# Patient Record
Sex: Male | Born: 1960 | Race: White | Hispanic: No | Marital: Married | Smoking: Never smoker
Health system: Southern US, Community
[De-identification: ages and names within clinical notes are randomized; demographics above are authoritative.]

## PROBLEM LIST (undated history)

## (undated) DIAGNOSIS — K219 Gastro-esophageal reflux disease without esophagitis: Secondary | ICD-10-CM

## (undated) DIAGNOSIS — E785 Hyperlipidemia, unspecified: Secondary | ICD-10-CM

## (undated) DIAGNOSIS — J189 Pneumonia, unspecified organism: Secondary | ICD-10-CM

## (undated) DIAGNOSIS — I10 Essential (primary) hypertension: Secondary | ICD-10-CM

## (undated) DIAGNOSIS — F32A Depression, unspecified: Secondary | ICD-10-CM

## (undated) DIAGNOSIS — M797 Fibromyalgia: Secondary | ICD-10-CM

## (undated) DIAGNOSIS — M1990 Unspecified osteoarthritis, unspecified site: Secondary | ICD-10-CM

## (undated) DIAGNOSIS — F329 Major depressive disorder, single episode, unspecified: Secondary | ICD-10-CM

## (undated) DIAGNOSIS — F419 Anxiety disorder, unspecified: Secondary | ICD-10-CM

## (undated) DIAGNOSIS — G8929 Other chronic pain: Secondary | ICD-10-CM

## (undated) DIAGNOSIS — G629 Polyneuropathy, unspecified: Secondary | ICD-10-CM

## (undated) DIAGNOSIS — M549 Dorsalgia, unspecified: Secondary | ICD-10-CM

## (undated) HISTORY — PX: SHOULDER SURGERY: SHX246

## (undated) HISTORY — PX: CARPAL TUNNEL RELEASE: SHX101

## (undated) HISTORY — PX: MANDIBLE RECONSTRUCTION: SHX431

---

## 2000-08-26 ENCOUNTER — Encounter: Admission: RE | Admit: 2000-08-26 | Discharge: 2000-08-26 | Payer: Self-pay | Admitting: Family Medicine

## 2000-08-26 ENCOUNTER — Encounter: Payer: Self-pay | Admitting: Family Medicine

## 2003-07-19 ENCOUNTER — Emergency Department (HOSPITAL_COMMUNITY): Admission: EM | Admit: 2003-07-19 | Discharge: 2003-07-19 | Payer: Self-pay | Admitting: Emergency Medicine

## 2005-01-09 ENCOUNTER — Emergency Department (HOSPITAL_COMMUNITY): Admission: EM | Admit: 2005-01-09 | Discharge: 2005-01-09 | Payer: Self-pay | Admitting: Emergency Medicine

## 2005-01-30 ENCOUNTER — Ambulatory Visit (HOSPITAL_COMMUNITY): Admission: RE | Admit: 2005-01-30 | Discharge: 2005-01-30 | Payer: Self-pay | Admitting: Sports Medicine

## 2006-03-29 ENCOUNTER — Ambulatory Visit (HOSPITAL_COMMUNITY): Admission: RE | Admit: 2006-03-29 | Discharge: 2006-03-29 | Payer: Self-pay | Admitting: Sports Medicine

## 2006-04-22 ENCOUNTER — Emergency Department (HOSPITAL_COMMUNITY): Admission: EM | Admit: 2006-04-22 | Discharge: 2006-04-22 | Payer: Self-pay | Admitting: Emergency Medicine

## 2006-07-04 ENCOUNTER — Emergency Department (HOSPITAL_COMMUNITY): Admission: EM | Admit: 2006-07-04 | Discharge: 2006-07-04 | Payer: Self-pay | Admitting: Emergency Medicine

## 2006-07-16 ENCOUNTER — Emergency Department (HOSPITAL_COMMUNITY): Admission: EM | Admit: 2006-07-16 | Discharge: 2006-07-16 | Payer: Self-pay | Admitting: Emergency Medicine

## 2007-01-22 ENCOUNTER — Emergency Department (HOSPITAL_COMMUNITY): Admission: EM | Admit: 2007-01-22 | Discharge: 2007-01-22 | Payer: Self-pay | Admitting: Family Medicine

## 2007-08-05 ENCOUNTER — Emergency Department (HOSPITAL_COMMUNITY): Admission: EM | Admit: 2007-08-05 | Discharge: 2007-08-05 | Payer: Self-pay | Admitting: Family Medicine

## 2009-06-24 ENCOUNTER — Ambulatory Visit (HOSPITAL_COMMUNITY): Admission: RE | Admit: 2009-06-24 | Discharge: 2009-06-24 | Payer: Self-pay | Admitting: Sports Medicine

## 2010-06-11 ENCOUNTER — Encounter: Payer: Self-pay | Admitting: Sports Medicine

## 2014-11-10 ENCOUNTER — Emergency Department (HOSPITAL_BASED_OUTPATIENT_CLINIC_OR_DEPARTMENT_OTHER)
Admission: EM | Admit: 2014-11-10 | Discharge: 2014-11-10 | Disposition: A | Discharge status: Home / Self Care | Payer: Self-pay | Attending: Emergency Medicine | Admitting: Emergency Medicine

## 2014-11-10 ENCOUNTER — Encounter (HOSPITAL_BASED_OUTPATIENT_CLINIC_OR_DEPARTMENT_OTHER): Payer: Self-pay | Admitting: *Deleted

## 2014-11-10 ENCOUNTER — Emergency Department (HOSPITAL_BASED_OUTPATIENT_CLINIC_OR_DEPARTMENT_OTHER): Payer: Self-pay

## 2014-11-10 DIAGNOSIS — I10 Essential (primary) hypertension: Secondary | ICD-10-CM | POA: Insufficient documentation

## 2014-11-10 DIAGNOSIS — K219 Gastro-esophageal reflux disease without esophagitis: Secondary | ICD-10-CM | POA: Insufficient documentation

## 2014-11-10 DIAGNOSIS — S93402A Sprain of unspecified ligament of left ankle, initial encounter: Secondary | ICD-10-CM | POA: Insufficient documentation

## 2014-11-10 DIAGNOSIS — Y9389 Activity, other specified: Secondary | ICD-10-CM | POA: Insufficient documentation

## 2014-11-10 DIAGNOSIS — Z79899 Other long term (current) drug therapy: Secondary | ICD-10-CM | POA: Insufficient documentation

## 2014-11-10 DIAGNOSIS — G629 Polyneuropathy, unspecified: Secondary | ICD-10-CM | POA: Insufficient documentation

## 2014-11-10 DIAGNOSIS — Y998 Other external cause status: Secondary | ICD-10-CM | POA: Insufficient documentation

## 2014-11-10 DIAGNOSIS — X58XXXA Exposure to other specified factors, initial encounter: Secondary | ICD-10-CM | POA: Insufficient documentation

## 2014-11-10 DIAGNOSIS — Y9289 Other specified places as the place of occurrence of the external cause: Secondary | ICD-10-CM | POA: Insufficient documentation

## 2014-11-10 HISTORY — DX: Gastro-esophageal reflux disease without esophagitis: K21.9

## 2014-11-10 HISTORY — DX: Essential (primary) hypertension: I10

## 2014-11-10 HISTORY — DX: Dorsalgia, unspecified: M54.9

## 2014-11-10 HISTORY — DX: Polyneuropathy, unspecified: G62.9

## 2014-11-10 NOTE — Discharge Instructions (Signed)
RICE: Routine Care for Injuries  The routine care of many injuries includes Rest, Ice, Compression, and Elevation (RICE).  HOME CARE INSTRUCTIONS   Rest is needed to allow your body to heal. Routine activities can usually be resumed when comfortable. Injured tendons and bones can take up to 6 weeks to heal. Tendons are the cord-like structures that attach muscle to bone.   Ice following an injury helps keep the swelling down and reduces pain.   Put ice in a plastic bag.   Place a towel between your skin and the bag.   Leave the ice on for 15-20 minutes, 3-4 times a day, or as directed by your health care provider. Do this while awake, for the first 24 to 48 hours. After that, continue as directed by your caregiver.   Compression helps keep swelling down. It also gives support and helps with discomfort. If an elastic bandage has been applied, it should be removed and reapplied every 3 to 4 hours. It should not be applied tightly, but firmly enough to keep swelling down. Watch fingers or toes for swelling, bluish discoloration, coldness, numbness, or excessive pain. If any of these problems occur, remove the bandage and reapply loosely. Contact your caregiver if these problems continue.   Elevation helps reduce swelling and decreases pain. With extremities, such as the arms, hands, legs, and feet, the injured area should be placed near or above the level of the heart, if possible.  SEEK IMMEDIATE MEDICAL CARE IF:   You have persistent pain and swelling.   You develop redness, numbness, or unexpected weakness.   Your symptoms are getting worse rather than improving after several days.  These symptoms may indicate that further evaluation or further X-rays are needed. Sometimes, X-rays may not show a small broken bone (fracture) until 1 week or 10 days later. Make a follow-up appointment with your caregiver. Ask when your X-ray results will be ready. Make sure you get your X-ray results.  Document Released:  08/19/2000 Document Revised: 05/12/2013 Document Reviewed: 10/06/2010  Exit[employer] Patient Information 2015 [employer]. This information is not intended to replace advice given to you by your health care provider. Make sure you discuss any questions you have with your health care provider.  Ankle Sprain  An ankle sprain is an injury to the strong, fibrous tissues (ligaments) that hold the bones of your ankle joint together.   CAUSES  An ankle sprain is usually caused by a fall or by twisting your ankle. Ankle sprains most commonly occur when you step on the outer edge of your foot, and your ankle turns inward. People who participate in sports are more prone to these types of injuries.   SYMPTOMS    Pain in your ankle. The pain may be present at rest or only when you are trying to stand or walk.   Swelling.   Bruising. Bruising may develop immediately or within 1 to 2 days after your injury.   Difficulty standing or walking, particularly when turning corners or changing directions.  DIAGNOSIS   Your caregiver will ask you details about your injury and perform a physical exam of your ankle to determine if you have an ankle sprain. During the physical exam, your caregiver will press on and apply pressure to specific areas of your foot and ankle. Your caregiver will try to move your ankle in certain ways. An X-ray exam may be done to be sure a bone was not broken or a ligament did not separate   from one of the bones in your ankle (avulsion fracture).   TREATMENT   Certain types of braces can help stabilize your ankle. Your caregiver can make a recommendation for this. Your caregiver may recommend the use of medicine for pain. If your sprain is severe, your caregiver may refer you to a surgeon who helps to restore function to parts of your skeletal system (orthopedist) or a physical therapist.  HOME CARE INSTRUCTIONS    Apply ice to your injury for 1-2 days or as directed by your caregiver. Applying ice helps to  reduce inflammation and pain.   Put ice in a plastic bag.   Place a towel between your skin and the bag.   Leave the ice on for 15-20 minutes at a time, every 2 hours while you are awake.   Only take over-the-counter or prescription medicines for pain, discomfort, or fever as directed by your caregiver.   Elevate your injured ankle above the level of your heart as much as possible for 2-3 days.   If your caregiver recommends crutches, use them as instructed. Gradually put weight on the affected ankle. Continue to use crutches or a cane until you can walk without feeling pain in your ankle.   If you have a plaster splint, wear the splint as directed by your caregiver. Do not rest it on anything harder than a pillow for the first 24 hours. Do not put weight on it. Do not get it wet. You may take it off to take a shower or bath.   You may have been given an elastic bandage to wear around your ankle to provide support. If the elastic bandage is too tight (you have numbness or tingling in your foot or your foot becomes cold and blue), adjust the bandage to make it comfortable.   If you have an air splint, you may blow more air into it or let air out to make it more comfortable. You may take your splint off at night and before taking a shower or bath. Wiggle your toes in the splint several times per day to decrease swelling.  SEEK MEDICAL CARE IF:    You have rapidly increasing bruising or swelling.   Your toes feel extremely cold or you lose feeling in your foot.   Your pain is not relieved with medicine.  SEEK IMMEDIATE MEDICAL CARE IF:   Your toes are numb or blue.   You have severe pain that is increasing.  MAKE SURE YOU:    Understand these instructions.   Will watch your condition.   Will get help right away if you are not doing well or get worse.  Document Released: 05/07/2005 Document Revised: 01/30/2012 Document Reviewed: 05/19/2011  [employer] Patient Information 2015 [employer]. This  information is not intended to replace advice given to you by your health care provider. Make sure you discuss any questions you have with your health care provider.

## 2014-11-10 NOTE — ED Provider Notes (Signed)
CSN: [id number]     Arrival date & time 11/10/14  1558 History   First MD Initiated Contact with Patient 11/10/14 1611     Chief Complaint  Patient presents with  . Ankle Pain     (Consider location/radiation/quality/duration/timing/severity/associated sxs/prior Treatment) HPI Comments: 54 year old male complaining of left ankle pain and swelling 3 days. States he was the leg this weekend, and was "dunking" in the water with another person and believes he may have injured it then. He cannot recall any specific acute pain. Over the past few days, he's had pain with walking, and noticed that it started to swell, especially at night. He has been icing and elevating it with some relief. Denies numbness or tingling.  Patient is a 54 y.o. male presenting with ankle pain. The history is provided by the patient.  Ankle Pain   Past Medical History  Diagnosis Date  . Back pain   . Hypertension   . Neuropathy   . GERD (gastroesophageal reflux disease)    Past Surgical History  Procedure Laterality Date  . Shoulder surgery    . Carpal tunnel release     No family history on file. History  Substance Use Topics  . Smoking status: Never Smoker   . Smokeless tobacco: Not on file  . Alcohol Use: Yes    Review of Systems  Constitutional: Negative for activity change.  Musculoskeletal:       + L ankle pain and swelling.  Skin: Negative for color change.  Neurological: Negative for numbness.      Allergies  Tramadol and Septra  Home Medications   Prior to Admission medications   Medication Sig Start Date End Date Taking? Authorizing Provider  ALPRAZolam Prudy Feeler) 1 MG tablet Take 1 mg by mouth at bedtime as needed for anxiety.   Yes Historical Provider, MD  FLUoxetine (PROZAC) 10 MG tablet Take 10 mg by mouth daily.   Yes Historical Provider, MD  gabapentin (NEURONTIN) 100 MG capsule Take 100 mg by mouth 3 (three) times daily.   Yes Historical Provider, MD   HYDROcodone-acetaminophen (NORCO/VICODIN) 5-325 MG per tablet Take 1 tablet by mouth every 6 (six) hours as needed for moderate pain.   Yes Historical Provider, MD  lansoprazole (PREVACID) 30 MG capsule Take 30 mg by mouth daily at 12 noon.   Yes Historical Provider, MD   BP 160/93 mmHg  Pulse 81  Temp(Src) 98.5 F (36.9 C) (Oral)  Resp 18  Ht 5' 3.5" (1.613 m)  Wt 205 lb (92.987 kg)  BMI 35.74 kg/m2  SpO2 98% Physical Exam  Constitutional: He is oriented to person, place, and time. He appears well-developed and well-nourished. No distress.  HENT:  Head: Normocephalic and atraumatic.  Eyes: Conjunctivae and EOM are normal.  Neck: Normal range of motion. Neck supple.  Cardiovascular: Normal rate, regular rhythm and normal heart sounds.   Pulmonary/Chest: Effort normal and breath sounds normal.  Musculoskeletal:  L ankle non-tender, mild swelling laterally.No deformity. FROM. +2 PT/DP pulse.  Neurological: He is alert and oriented to person, place, and time.  Skin: Skin is warm and dry.  Psychiatric: He has a normal mood and affect. His behavior is normal.  Nursing note and vitals reviewed.   ED Course  Procedures (including critical care time) Labs Review Labs Reviewed - No data to display  Imaging Review Dg Ankle Complete Left  11/10/2014   CLINICAL DATA:  [place]  EXAM: LEFT ANKLE  COMPLETE - 3+ VIEW  COMPARISON:  None.  FINDINGS: There is no evidence of fracture, dislocation, or joint effusion. There is no evidence of arthropathy or other focal bone abnormality. Soft tissues are unremarkable.  IMPRESSION: Normal left ankle.   Electronically Signed   By: Lupita Raider, M.D.   On: 11/10/2014 16:31     EKG Interpretation None      MDM   Final diagnoses:  Left ankle sprain, initial encounter    Neurovascularly intact. X-ray negative. Ace wrap applied. Advised rice and NSAIDs. Stable for discharge. Return  precautions given. Patient states understanding of treatment care plan and is agreeable.  Kathrynn Speed, PA-C 11/10/14 1655  Rolan Bucco, MD 11/10/14 [phone]

## 2014-11-10 NOTE — ED Notes (Signed)
Pt c/o left ankle pain and swelling. [place]

## 2015-10-03 ENCOUNTER — Encounter (HOSPITAL_COMMUNITY): Payer: Self-pay | Admitting: Emergency Medicine

## 2015-10-03 ENCOUNTER — Emergency Department (HOSPITAL_COMMUNITY)
Admission: EM | Admit: 2015-10-03 | Discharge: 2015-10-03 | Disposition: A | Discharge status: Home / Self Care | Payer: Self-pay | Attending: Emergency Medicine | Admitting: Emergency Medicine

## 2015-10-03 DIAGNOSIS — Z79899 Other long term (current) drug therapy: Secondary | ICD-10-CM | POA: Insufficient documentation

## 2015-10-03 DIAGNOSIS — Y939 Activity, unspecified: Secondary | ICD-10-CM | POA: Insufficient documentation

## 2015-10-03 DIAGNOSIS — Z79891 Long term (current) use of opiate analgesic: Secondary | ICD-10-CM | POA: Insufficient documentation

## 2015-10-03 DIAGNOSIS — I10 Essential (primary) hypertension: Secondary | ICD-10-CM | POA: Insufficient documentation

## 2015-10-03 DIAGNOSIS — Y990 Civilian activity done for income or pay: Secondary | ICD-10-CM | POA: Insufficient documentation

## 2015-10-03 DIAGNOSIS — X18XXXA Contact with other hot metals, initial encounter: Secondary | ICD-10-CM | POA: Insufficient documentation

## 2015-10-03 DIAGNOSIS — T1501XA Foreign body in cornea, right eye, initial encounter: Secondary | ICD-10-CM | POA: Insufficient documentation

## 2015-10-03 DIAGNOSIS — T1592XA Foreign body on external eye, part unspecified, left eye, initial encounter: Secondary | ICD-10-CM

## 2015-10-03 DIAGNOSIS — G629 Polyneuropathy, unspecified: Secondary | ICD-10-CM | POA: Insufficient documentation

## 2015-10-03 DIAGNOSIS — Y9269 Other specified industrial and construction area as the place of occurrence of the external cause: Secondary | ICD-10-CM | POA: Insufficient documentation

## 2015-10-03 DIAGNOSIS — K219 Gastro-esophageal reflux disease without esophagitis: Secondary | ICD-10-CM | POA: Insufficient documentation

## 2015-10-03 MED ORDER — CIPROFLOXACIN HCL 0.3 % OP SOLN: 1.0000 [drp] | Freq: Two times a day (BID) | OPHTHALMIC | Status: DC

## 2015-10-03 MED ORDER — FLUORESCEIN SODIUM 1 MG OP STRP
1.0000 | ORAL_STRIP | Freq: Once | OPHTHALMIC | Status: AC
  Administered 2015-10-03: 1 via OPHTHALMIC
  Filled 2015-10-03: qty 1

## 2015-10-03 MED ORDER — TETRACAINE HCL 0.5 % OP SOLN
2.0000 [drp] | Freq: Once | OPHTHALMIC | Status: AC
  Administered 2015-10-03: 2 [drp] via OPHTHALMIC
  Filled 2015-10-03: qty 4

## 2015-10-03 NOTE — ED Notes (Signed)
Bed: WA19 Expected date:  Expected time:  Means of arrival:  Comments: 

## 2015-10-03 NOTE — ED Notes (Addendum)
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 Eye is swollen and pt reports that it is "cloudy and blurry" with yellow drainage. Pt has tried flushing the eye and eye drops with no relief. Pt reports taking Hydrocodone at 0900 today.

## 2015-10-03 NOTE — Discharge Instructions (Signed)
Take cipro eye drops to right eye twice daily for 3-4 days until your eye is completely normal.   See eye doctor in a week if you still have pain   Wear eye protection next time.   Return to ER if you have severe eye pain, eye redness or swelling

## 2015-10-03 NOTE — ED Notes (Signed)
Woods lamp and meds bedside.

## 2015-10-03 NOTE — ED Provider Notes (Signed)
CSN: [id number]     Arrival date & time 10/03/15  1017 History   First MD Initiated Contact with Patient 10/03/15 1144     Chief Complaint  Patient presents with  . Foreign Body in Eye     (Consider location/radiation/quality/duration/timing/severity/associated sxs/prior Treatment) The history is provided by the patient.  Eugene Hardin is a 55 y.o. male she hypertension here presenting with right eye foreign body. 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 Dates that he was sawing metal at that time and a piece of metal may have gotten into his eye. He states that he has been washing his eye out but his eye has been red and his vision has been blurry. He's been taking hydrocodone this morning with no relief. Denies any other injuries.    Past Medical History  Diagnosis Date  . Back pain   . Hypertension   . Neuropathy (HCC)   . GERD (gastroesophageal reflux disease)    Past Surgical History  Procedure Laterality Date  . Shoulder surgery    . Carpal tunnel release     History reviewed. No pertinent family history. Social History  Substance Use Topics  . Smoking status: Never Smoker   . Smokeless tobacco: None  . Alcohol Use: Yes    Review of Systems  Eyes: Positive for pain and redness.  All other systems reviewed and are negative.     Allergies  Tramadol and Septra  Home Medications   Prior to Admission medications   Medication Sig Start Date End Date Taking? Authorizing Provider  ALPRAZolam Prudy Feeler) 1 MG tablet Take 1 mg by mouth at bedtime as needed for anxiety.    Historical Provider, MD  ciprofloxacin (CILOXAN) 0.3 % ophthalmic solution Place 1 drop into the right eye 2 (two) times daily. Administer 1 drop, every 2 hours, while awake, for 2 days. Then 1 drop, every 4 hours, while awake, for the next 5 days. 10/03/15   Richardean Canal, MD  FLUoxetine (PROZAC) 10 MG tablet Take 10 mg by mouth daily.    Historical Provider, MD   gabapentin (NEURONTIN) 100 MG capsule Take 100 mg by mouth 3 (three) times daily.    Historical Provider, MD  HYDROcodone-acetaminophen (NORCO/VICODIN) 5-325 MG per tablet Take 1 tablet by mouth every 6 (six) hours as needed for moderate pain.    Historical Provider, MD  lansoprazole (PREVACID) 30 MG capsule Take 30 mg by mouth daily at 12 noon.    Historical Provider, MD   BP 139/93 mmHg  Pulse 73  Temp(Src) 97.9 F (36.6 C) (Oral)  Resp 16  SpO2 94% Physical Exam  Constitutional: He is oriented to person, place, and time. He appears well-developed.  Slightly uncomfortable   HENT:  Head: Normocephalic.  Mouth/Throat: Oropharynx is clear and moist.  Eyes: Pupils are equal, round, and reactive to light.  R eye with small piece of metal right over the pupil. Fluorescein applied and there is no sidel sign and no other corneal abrasion or foreign under eyelid. Extra ocular movements intact   Neck: Normal range of motion. Neck supple.  Cardiovascular: Normal rate, regular rhythm and normal heart sounds.   Pulmonary/Chest: Effort normal and breath sounds normal. No respiratory distress. He has no wheezes. He has no rales.  Abdominal: Soft. Bowel sounds are normal. He exhibits no distension. There is no tenderness. There is no rebound.  Musculoskeletal: Normal range of motion.  Neurological: He is  alert and oriented to person, place, and time. No cranial nerve deficit. Coordination normal.  Skin: Skin is warm and dry.  Psychiatric: He has a normal mood and affect. His behavior is normal. Judgment and thought content normal.  Vitals reviewed.   ED Course  .Foreign Body Removal Date/Time: 10/03/2015 12:38 PM Performed by: Richardean CanalYAO, Pablo Mathurin H Authorized by: Richardean CanalYAO, Tomica Arseneault H Consent: Verbal consent obtained. Risks and benefits: risks, benefits and alternatives were discussed Consent given by: patient Patient understanding: patient states understanding of the procedure being performed Patient  consent: the patient's understanding of the procedure matches consent given Procedure consent: procedure consent matches procedure scheduled Relevant documents: relevant documents present and verified Patient identity confirmed: verbally with patient Body area: eye Location details: right cornea Patient sedated: no Patient restrained: no Localization method: slit lamp Removal mechanism: 27-gauge needle Eye examined with fluorescein. Corneal abrasion size: small Corneal abrasion location: central No residual rust ring present. Dressing: antibiotic drops Depth: embedded Complexity: simple 1 objects recovered. Post-procedure assessment: foreign body removed Patient tolerance: Patient tolerated the procedure well with no immediate complications   (including critical care time)    Labs Review Labs Reviewed - No data to display  Imaging Review No results found. I have personally reviewed and evaluated these images and lab results as part of my medical decision-making.   EKG Interpretation None      MDM   Final diagnoses:  Eye foreign body, left, initial encounter    Ladell HeadsBilly W Hardin is a 55 y.o. male here with R eye foreign body. I removed it and no rust ring observed, no sidel sign before or afterwards. Dc home with cipro eye drops empirically. Follow up with ophtho as needed.    Richardean Canalavid H Malone Vanblarcom, MD 10/03/15 1240

## 2016-01-08 ENCOUNTER — Emergency Department (HOSPITAL_COMMUNITY)
Admission: EM | Admit: 2016-01-08 | Discharge: 2016-01-09 | Disposition: A | Discharge status: Home / Self Care | Payer: Self-pay | Attending: Emergency Medicine | Admitting: Emergency Medicine

## 2016-01-08 ENCOUNTER — Encounter (HOSPITAL_COMMUNITY): Payer: Self-pay | Admitting: Emergency Medicine

## 2016-01-08 ENCOUNTER — Emergency Department (HOSPITAL_COMMUNITY): Payer: Self-pay

## 2016-01-08 DIAGNOSIS — Y929 Unspecified place or not applicable: Secondary | ICD-10-CM | POA: Insufficient documentation

## 2016-01-08 DIAGNOSIS — Z79899 Other long term (current) drug therapy: Secondary | ICD-10-CM | POA: Insufficient documentation

## 2016-01-08 DIAGNOSIS — IMO0002 Reserved for concepts with insufficient information to code with codable children: Secondary | ICD-10-CM

## 2016-01-08 DIAGNOSIS — S51811A Laceration without foreign body of right forearm, initial encounter: Secondary | ICD-10-CM | POA: Insufficient documentation

## 2016-01-08 DIAGNOSIS — I10 Essential (primary) hypertension: Secondary | ICD-10-CM | POA: Insufficient documentation

## 2016-01-08 DIAGNOSIS — W268XXA Contact with other sharp object(s), not elsewhere classified, initial encounter: Secondary | ICD-10-CM | POA: Insufficient documentation

## 2016-01-08 DIAGNOSIS — Y999 Unspecified external cause status: Secondary | ICD-10-CM | POA: Insufficient documentation

## 2016-01-08 DIAGNOSIS — Y939 Activity, unspecified: Secondary | ICD-10-CM | POA: Insufficient documentation

## 2016-01-08 DIAGNOSIS — Z23 Encounter for immunization: Secondary | ICD-10-CM | POA: Insufficient documentation

## 2016-01-08 LAB — I-STAT CHEM 8, ED
BUN: 20 mg/dL (ref 6–20)
CHLORIDE: 105 mmol/L (ref 101–111)
CREATININE: 0.8 mg/dL (ref 0.61–1.24)
Calcium, Ion: 1.2 mmol/L (ref 1.13–1.30)
GLUCOSE: 129 mg/dL — AB (ref 65–99)
HCT: 42 % (ref 39.0–52.0)
Hemoglobin: 14.3 g/dL (ref 13.0–17.0)
POTASSIUM: 3.9 mmol/L (ref 3.5–5.1)
Sodium: 143 mmol/L (ref 135–145)
TCO2: 28 mmol/L (ref 0–100)

## 2016-01-08 MED ORDER — TETANUS-DIPHTH-ACELL PERTUSSIS 5-2.5-18.5 LF-MCG/0.5 IM SUSP
0.5000 mL | Freq: Once | INTRAMUSCULAR | Status: AC
  Administered 2016-01-08: 0.5 mL via INTRAMUSCULAR
  Filled 2016-01-08: qty 0.5

## 2016-01-08 MED ORDER — LIDOCAINE-EPINEPHRINE 2 %-1:100000 IJ SOLN
20.0000 mL | Freq: Once | INTRAMUSCULAR | Status: AC
  Administered 2016-01-08: 20 mL
  Filled 2016-01-08: qty 1

## 2016-01-08 MED ORDER — ACETAMINOPHEN 325 MG PO TABS
650.0000 mg | ORAL_TABLET | Freq: Once | ORAL | Status: AC
  Administered 2016-01-08: 650 mg via ORAL
  Filled 2016-01-08: qty 2

## 2016-01-08 MED ORDER — BACITRACIN ZINC 500 UNIT/GM EX OINT
TOPICAL_OINTMENT | Freq: Once | CUTANEOUS | Status: AC
Start: 2016-01-09 — End: 2016-01-09
  Administered 2016-01-09: 1 via TOPICAL
  Filled 2016-01-08: qty 0.9

## 2016-01-08 MED ORDER — SODIUM CHLORIDE 0.9 % IV BOLUS (SEPSIS)
1000.0000 mL | Freq: Once | INTRAVENOUS | Status: AC
Start: 2016-01-09 — End: 2016-01-09
  Administered 2016-01-09: 1000 mL via INTRAVENOUS

## 2016-01-08 NOTE — ED Notes (Signed)
EDP at bedside  

## 2016-01-08 NOTE — ED Triage Notes (Signed)
Patient got arm caught in an industrial fan. Patient has approx 3 inch laceration to right forearm.

## 2016-01-08 NOTE — ED Notes (Signed)
Suture cart, tray and lidocaine at bedside

## 2016-01-09 ENCOUNTER — Other Ambulatory Visit: Payer: Self-pay | Admitting: Orthopedic Surgery

## 2016-01-09 LAB — TYPE AND SCREEN
ABO/RH(D): O POS
Antibody Screen: NEGATIVE

## 2016-01-09 LAB — ABO/RH: ABO/RH(D): O POS

## 2016-01-09 MED ORDER — CEPHALEXIN 500 MG PO CAPS
500.0000 mg | ORAL_CAPSULE | Freq: Once | ORAL | Status: AC
  Administered 2016-01-09: 500 mg via ORAL
  Filled 2016-01-09: qty 1

## 2016-01-09 MED ORDER — CEPHALEXIN 500 MG PO CAPS: 500.0000 mg | ORAL_CAPSULE | Freq: Two times a day (BID) | ORAL | 0 refills | Status: DC

## 2016-01-09 MED ORDER — DOXYCYCLINE HYCLATE 100 MG PO CAPS: 100.0000 mg | ORAL_CAPSULE | Freq: Two times a day (BID) | ORAL | 0 refills | Status: DC

## 2016-01-09 NOTE — Discharge Instructions (Signed)
Read the information below.   Keep wound covered for the next 24hours. Following you can clean with warm soap and water. Apply antibiotic ointment, and apply dressing. Keep wound clean and dry.  It is important that you return for wound check in 3 days. You are being placed on antibiotics, take as directed.   Use the prescribed medication as directed.  Please discuss all new medications with your pharmacist.   It is possible you may have some nerve damage following the repair.  Dr. Mina MarbleWeingold of Hand Surgery will see you in the office today. Contact information is provided. Please call in the morning.  Sutures can be removed in approximately 7 days.  Keep watch for signs of infection to include fever, redness/swelling/pain/purulent discharge from wound, or streaking.  You may return to the Emergency Department at any time for worsening condition or any new symptoms that concern you.

## 2016-01-09 NOTE — ED Provider Notes (Signed)
[facility]-EMERGENCY DEPT Provider Note   CSN: [id number] Arrival date & time: 01/08/16  1829     History   Chief Complaint Chief Complaint  Patient presents with  . Laceration    HPI Eugene Hardin is a 55 y.o. male.  Eugene Hardin is a 55 y.o. male with h/o HTN, neuropathy, GERD, and back pain presents to ED with complaint of laceration. 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 The fan turned on and lacerated his right forearm. Patient placed towels around laceration and came to ED. Patient denies numbness, weakness, or fever. Denies foreign body. He denies any immunocompromising conditions. No anti-coagulation therapy. He is unsure of his last tetanus shot.    The history is provided by the patient and medical records.    Past Medical History:  Diagnosis Date  . Back pain   . GERD (gastroesophageal reflux disease)   . Hypertension   . Neuropathy (HCC)     There are no active problems to display for this patient.   Past Surgical History:  Procedure Laterality Date  . CARPAL TUNNEL RELEASE    . SHOULDER SURGERY         Home Medications    Prior to Admission medications   Medication Sig Start Date End Date Taking? Authorizing Provider  ALPRAZolam Prudy Feeler) 1 MG tablet Take 1 mg by mouth at bedtime as needed for anxiety.   Yes Historical Provider, MD  FLUoxetine (PROZAC) 10 MG tablet Take 10 mg by mouth daily.   Yes Historical Provider, MD  gabapentin (NEURONTIN) 400 MG capsule Take 400 mg by mouth at bedtime.   Yes Historical Provider, MD  HYDROcodone-acetaminophen (NORCO/VICODIN) 5-325 MG per tablet Take 1 tablet by mouth every 6 (six) hours as needed for moderate pain.   Yes Historical Provider, MD  lansoprazole (PREVACID) 30 MG capsule Take 30 mg by mouth daily at 12 noon.   Yes Historical Provider, MD  loratadine (CLARITIN) 10 MG tablet Take 10 mg by mouth daily.   Yes Historical Provider, MD  lovastatin  (MEVACOR) 20 MG tablet Take 20 mg by mouth daily.    Yes Historical Provider, MD  meloxicam (MOBIC) 7.5 MG tablet Take one tablet daily as needed for pain. Take with food 12/20/15 12/19/16 Yes Historical Provider, MD  cephALEXin (KEFLEX) 500 MG capsule Take 1 capsule (500 mg total) by mouth 2 (two) times daily. 01/09/16   Lona Kettle, PA-C  ciprofloxacin (CILOXAN) 0.3 % ophthalmic solution Place 1 drop into the right eye 2 (two) times daily. Administer 1 drop, every 2 hours, while awake, for 2 days. Then 1 drop, every 4 hours, while awake, for the next 5 days. Patient not taking: Reported on 01/08/2016 10/03/15   Charlynne Pander, MD  doxycycline (VIBRAMYCIN) 100 MG capsule Take 1 capsule (100 mg total) by mouth 2 (two) times daily. 01/09/16   Derwood Kaplan, MD    Family History No family history on file.  Social History Social History  Substance Use Topics  . Smoking status: Never Smoker  . Smokeless tobacco: Not on file  . Alcohol use Yes     Allergies   Tramadol and Septra [sulfamethoxazole-trimethoprim]   Review of Systems Review of Systems  Constitutional: Negative for fever.  Skin: Positive for wound.  Allergic/Immunologic: Negative for immunocompromised state.  Neurological: Negative for weakness and numbness.     Physical Exam Updated Vital Signs BP 118/82 (BP Location: Right Arm)  Pulse 76   Temp 98.7 F (37.1 C) (Oral)   Resp 18   SpO2 100%   Physical Exam  Constitutional: He appears well-developed and well-nourished. No distress.  HENT:  Head: Normocephalic and atraumatic.  Eyes: Conjunctivae are normal. No scleral icterus.  Neck: Normal range of motion.  Cardiovascular: Intact distal pulses.  Tachycardia present.   Pulmonary/Chest: Effort normal. No respiratory distress.  Abdominal: He exhibits no distension.  Musculoskeletal:  ROM intact in right hand. Strength and sensation intact. Radial pulse 2+. Capillary refill ,3sec.   Neurological: He is  alert. He is not disoriented. GCS eye subscore is 4. GCS verbal subscore is 5. GCS motor subscore is 6.  Skin: Skin is warm and dry. Capillary refill takes less than 2 seconds. He is not diaphoretic.     Psychiatric: He has a normal mood and affect. His behavior is normal.     ED Treatments / Results  Labs (all labs ordered are listed, but only abnormal results are displayed) Labs Reviewed  I-STAT CHEM 8, ED - Abnormal; Notable for the following:       Result Value   Glucose, Bld 129 (*)    All other components within normal limits  TYPE AND SCREEN  ABO/RH    EKG  EKG Interpretation None       Radiology Dg Forearm Right  Result Date: 01/08/2016 CLINICAL DATA:  Industrial fan fell on arm with soft tissue laceration, initial encounter EXAM: RIGHT FOREARM - 2 VIEW COMPARISON:  None. FINDINGS: Soft tissue injury is noted consistent with the given clinical history. No radiopaque foreign body is noted. No acute fracture or dislocation is seen. Some degenerative changes about the elbow joint are noted. IMPRESSION: Soft tissue injury without acute bony abnormality. Electronically Signed   By: Alcide CleverMark  Lukens M.D.   On: 01/08/2016 19:47    Procedures .Marland Kitchen.Laceration Repair Date/Time: 01/09/2016 2:23 AM Performed by: Lona KettleMEYER, ASHLEY LAUREL Authorized by: Lona KettleMEYER, ASHLEY LAUREL   Consent:    Consent obtained:  Verbal   Consent given by:  Patient   Risks discussed:  Infection, need for additional repair, nerve damage, poor cosmetic result, poor wound healing, vascular damage, tendon damage and pain   Alternatives discussed:  No treatment Laceration details:    Location:  Shoulder/arm   Shoulder/arm location:  R lower arm   Length (cm):  18   Depth (mm):  7 Repair type:    Repair type:  Intermediate Pre-procedure details:    Preparation:  Patient was prepped and draped in usual sterile fashion and imaging obtained to evaluate for foreign bodies Exploration:    Hemostasis achieved with:   Tied off vessels, tourniquet, epinephrine and direct pressure (Completed by Dr. Rhunette CroftNanavati)   Wound exploration: entire depth of wound probed and visualized     Wound extent: vascular damage     Wound extent: no foreign bodies/material noted, no muscle damage noted and no underlying fracture noted     Contaminated: no   Treatment:    Area cleansed with:  Saline   Amount of cleaning:  Extensive   Irrigation solution:  Sterile saline   Irrigation volume:  1000ml   Irrigation method:  Pressure wash and syringe   Visualized foreign bodies/material removed: yes   Skin repair:    Repair method:  Sutures   Suture size:  4-0   Suture material:  Prolene   Number of sutures:  18 Approximation:    Approximation:  Close Post-procedure details:    Dressing:  Antibiotic ointment and sterile dressing   Patient tolerance of procedure:  Tolerated with difficulty Comments:     Patient became pale, diaphoretic, and nauseous during    (including critical care time)  Medications Ordered in ED Medications  Tdap (BOOSTRIX) injection 0.5 mL (0.5 mLs Intramuscular Given 01/08/16 2020)  lidocaine-EPINEPHrine (XYLOCAINE W/EPI) 2 %-1:100000 (with pres) injection 20 mL (20 mLs Infiltration Given by Other 01/08/16 2350)  acetaminophen (TYLENOL) tablet 650 mg (650 mg Oral Given 01/08/16 2032)  sodium chloride 0.9 % bolus 1,000 mL (0 mLs Intravenous Stopped 01/09/16 0136)  bacitracin ointment (1 application Topical Given 01/09/16 0030)  cephALEXin (KEFLEX) capsule 500 mg (500 mg Oral Given 01/09/16 0030)     Initial Impression / Assessment and Plan / ED Course  I have reviewed the triage vital signs and the nursing notes.  Pertinent labs & imaging results that were available during my care of the patient were reviewed by me and considered in my medical decision making (see chart for details).  Clinical Course   Patient is afebrile and non-toxic appearing in NAD. He is resting comfortably in bed with his right  forearm bandaged and at the level of his heart. He is slightly tachycardic and hypertensive on initial presentation. Physical exam remarkable for two lacerations to right forearm on ulnar/dorsal surface. Pulsatile bleeding noted. Strength, ROM, and sensation in right upper extremity distal to laceration intact. Radial pulse 2+, capillary refill <3 seconds. X-ray negative for bony involvement or FB. Irrigation performed. Hemostasis was achieved by Dr. Rhunette CroftNanavati by tying off vessels. Following hemostasis, further extensive irrigation was performed. Wound was explored and base of wound visualized in bloodless field without evidence of foreign body. Following closure patient complained of numbness in hand. On neuro re-evaluation following closure, 2+ radial pulse, patient motor and strength intact, decrease sensation in dorsum and palmar surface of 1st and 2nd digit and ventral surface of forearm distal to laceration - ?nerve damage vs. ? Neurapraxia from turniquet. During repair, patient became diaphoretic, pale and nauseous - concern for possible vaso-vgal response, no LOC. IV fluids given with improvement in sxs. H/H showed normal hgb/hct following repair.   Laceration occurred < 8 hours prior to repair. Tdap updated.  Pt has no comorbidities to effect normal wound healing. Pt discharged on antibiotics given extent of laceration.  Discussed suture home care with patient and answered questions. Dr. Rhunette CroftNanavati consult Hand Surgery Dr Mina MarbleWeingold, greatly appreciate his time and input, agreeable to see patient  In office. Wound re-check in 3 days. Pt to follow-up for suture removal in 7 days; they are to return to the ED sooner for signs of infection. Pt is hemodynamically stable with no complaints prior to dc.  Decrease sensation on dorsum and palmar surface at 1st and 2nd digit  Final Clinical Impressions(s) / ED Diagnoses   Final diagnoses:  Laceration    New Prescriptions Discharge Medication List as of  01/09/2016 12:15 AM    START taking these medications   Details  cephALEXin (KEFLEX) 500 MG capsule Take 1 capsule (500 mg total) by mouth 2 (two) times daily., Starting Mon 01/09/2016, Print         SykestonAshley Laurel Meyer, PA-C 01/09/16 [id number]    Derwood KaplanAnkit Nanavati, MD 01/10/16 [phone]

## 2016-01-10 ENCOUNTER — Encounter (HOSPITAL_BASED_OUTPATIENT_CLINIC_OR_DEPARTMENT_OTHER): Payer: Self-pay | Admitting: *Deleted

## 2016-01-10 NOTE — Progress Notes (Signed)
   01/10/16 1030  OBSTRUCTIVE SLEEP APNEA  Have you ever been diagnosed with sleep apnea through a sleep study? No  Do you snore loudly (loud enough to be heard through closed doors)?  1  Do you often feel tired, fatigued, or sleepy during the daytime (such as falling asleep during driving or talking to someone)? 0  Has anyone observed you stop breathing during your sleep? 0  Do you have, or are you being treated for high blood pressure? 1  BMI more than 35 kg/m2? 1  Age > 50 (1-yes) 1  Male Gender (Yes=1) 1  Obstructive Sleep Apnea Score 5

## 2016-01-10 NOTE — Anesthesia Preprocedure Evaluation (Signed)
Anesthesia Evaluation  Patient identified by MRN, date of birth, ID band Patient awake    Reviewed: Allergy & Precautions, NPO status , Patient's Chart, lab work & pertinent test results  Airway Mallampati: II  TM Distance: >3 FB Neck ROM: Full    Dental no notable dental hx.    Pulmonary neg pulmonary ROS,    Pulmonary exam normal breath sounds clear to auscultation       Cardiovascular hypertension, negative cardio ROS Normal cardiovascular exam Rhythm:Regular Rate:Normal     Neuro/Psych negative neurological ROS  negative psych ROS   GI/Hepatic negative GI ROS, Neg liver ROS,   Endo/Other  negative endocrine ROS  Renal/GU negative Renal ROS  negative genitourinary   Musculoskeletal negative musculoskeletal ROS (+)   Abdominal   Peds  Hematology negative hematology ROS (+)   Anesthesia Other Findings   Reproductive/Obstetrics negative OB ROS                             Anesthesia Physical Anesthesia Plan  ASA: II  Anesthesia Plan: General   Post-op Pain Management:    Induction: Intravenous  Airway Management Planned: LMA  Additional Equipment:   Intra-op Plan:   Post-operative Plan: Extubation in OR  Informed Consent: I have reviewed the patients History and Physical, chart, labs and discussed the procedure including the risks, benefits and alternatives for the proposed anesthesia with the patient or authorized representative who has indicated his/her understanding and acceptance.   Dental advisory given  Plan Discussed with: CRNA  Anesthesia Plan Comments:         Anesthesia Quick Evaluation

## 2016-01-11 ENCOUNTER — Ambulatory Visit (HOSPITAL_BASED_OUTPATIENT_CLINIC_OR_DEPARTMENT_OTHER)
Admission: RE | Admit: 2016-01-11 | Discharge: 2016-01-11 | Disposition: A | Discharge status: Home / Self Care | Payer: Self-pay | Source: Ambulatory Visit | Attending: Orthopedic Surgery | Admitting: Orthopedic Surgery

## 2016-01-11 ENCOUNTER — Encounter (HOSPITAL_BASED_OUTPATIENT_CLINIC_OR_DEPARTMENT_OTHER): Payer: Self-pay | Admitting: Anesthesiology

## 2016-01-11 ENCOUNTER — Ambulatory Visit (HOSPITAL_BASED_OUTPATIENT_CLINIC_OR_DEPARTMENT_OTHER): Payer: Self-pay | Admitting: Anesthesiology

## 2016-01-11 ENCOUNTER — Encounter (HOSPITAL_BASED_OUTPATIENT_CLINIC_OR_DEPARTMENT_OTHER)
Admission: RE | Disposition: A | Discharge status: Home / Self Care | Payer: Self-pay | Source: Ambulatory Visit | Attending: Orthopedic Surgery

## 2016-01-11 DIAGNOSIS — X58XXXA Exposure to other specified factors, initial encounter: Secondary | ICD-10-CM | POA: Insufficient documentation

## 2016-01-11 DIAGNOSIS — S61511A Laceration without foreign body of right wrist, initial encounter: Secondary | ICD-10-CM | POA: Insufficient documentation

## 2016-01-11 DIAGNOSIS — K219 Gastro-esophageal reflux disease without esophagitis: Secondary | ICD-10-CM | POA: Insufficient documentation

## 2016-01-11 DIAGNOSIS — S51811A Laceration without foreign body of right forearm, initial encounter: Secondary | ICD-10-CM | POA: Insufficient documentation

## 2016-01-11 DIAGNOSIS — G629 Polyneuropathy, unspecified: Secondary | ICD-10-CM | POA: Insufficient documentation

## 2016-01-11 DIAGNOSIS — I10 Essential (primary) hypertension: Secondary | ICD-10-CM | POA: Insufficient documentation

## 2016-01-11 DIAGNOSIS — Z79899 Other long term (current) drug therapy: Secondary | ICD-10-CM | POA: Insufficient documentation

## 2016-01-11 DIAGNOSIS — E785 Hyperlipidemia, unspecified: Secondary | ICD-10-CM | POA: Insufficient documentation

## 2016-01-11 DIAGNOSIS — F329 Major depressive disorder, single episode, unspecified: Secondary | ICD-10-CM | POA: Insufficient documentation

## 2016-01-11 DIAGNOSIS — S66821A Laceration of other specified muscles, fascia and tendons at wrist and hand level, right hand, initial encounter: Secondary | ICD-10-CM | POA: Insufficient documentation

## 2016-01-11 DIAGNOSIS — F419 Anxiety disorder, unspecified: Secondary | ICD-10-CM | POA: Insufficient documentation

## 2016-01-11 HISTORY — PX: WOUND EXPLORATION: SHX6188

## 2016-01-11 HISTORY — DX: Hyperlipidemia, unspecified: E78.5

## 2016-01-11 HISTORY — DX: Other chronic pain: G89.29

## 2016-01-11 HISTORY — DX: Depression, unspecified: F32.A

## 2016-01-11 HISTORY — DX: Major depressive disorder, single episode, unspecified: F32.9

## 2016-01-11 HISTORY — DX: Anxiety disorder, unspecified: F41.9

## 2016-01-11 SURGERY — WOUND EXPLORATION
Anesthesia: General | Site: Arm Lower | Laterality: Right

## 2016-01-11 MED ORDER — OXYCODONE-ACETAMINOPHEN 5-325 MG PO TABS: 1.0000 | ORAL_TABLET | ORAL | 0 refills | Status: DC | PRN

## 2016-01-11 MED ORDER — BUPIVACAINE-EPINEPHRINE (PF) 0.5% -1:200000 IJ SOLN
INTRAMUSCULAR | Status: AC
  Filled 2016-01-11: qty 30

## 2016-01-11 MED ORDER — KETOROLAC TROMETHAMINE 30 MG/ML IJ SOLN
INTRAMUSCULAR | Status: DC | PRN
  Administered 2016-01-11: 30 mg via INTRAVENOUS

## 2016-01-11 MED ORDER — LIDOCAINE HCL (PF) 1 % IJ SOLN
INTRAMUSCULAR | Status: AC
  Filled 2016-01-11: qty 30

## 2016-01-11 MED ORDER — MEPERIDINE HCL 25 MG/ML IJ SOLN: 6.2500 mg | INTRAMUSCULAR | Status: DC | PRN

## 2016-01-11 MED ORDER — MIDAZOLAM HCL 2 MG/2ML IJ SOLN
INTRAMUSCULAR | Status: AC
  Filled 2016-01-11: qty 2

## 2016-01-11 MED ORDER — PROPOFOL 10 MG/ML IV BOLUS
INTRAVENOUS | Status: DC | PRN
  Administered 2016-01-11: 200 mg via INTRAVENOUS

## 2016-01-11 MED ORDER — SCOPOLAMINE 1 MG/3DAYS TD PT72: 1.0000 | MEDICATED_PATCH | Freq: Once | TRANSDERMAL | Status: DC | PRN

## 2016-01-11 MED ORDER — CEFAZOLIN SODIUM-DEXTROSE 2-4 GM/100ML-% IV SOLN
2.0000 g | INTRAVENOUS | Status: AC
  Administered 2016-01-11: 2 g via INTRAVENOUS

## 2016-01-11 MED ORDER — BUPIVACAINE HCL (PF) 0.25 % IJ SOLN
INTRAMUSCULAR | Status: AC
  Filled 2016-01-11: qty 30

## 2016-01-11 MED ORDER — MIDAZOLAM HCL 2 MG/2ML IJ SOLN
1.0000 mg | INTRAMUSCULAR | Status: DC | PRN
  Administered 2016-01-11: 2 mg via INTRAVENOUS

## 2016-01-11 MED ORDER — DEXAMETHASONE SODIUM PHOSPHATE 10 MG/ML IJ SOLN
INTRAMUSCULAR | Status: AC
  Filled 2016-01-11: qty 1

## 2016-01-11 MED ORDER — FENTANYL CITRATE (PF) 100 MCG/2ML IJ SOLN
INTRAMUSCULAR | Status: AC
  Filled 2016-01-11: qty 4

## 2016-01-11 MED ORDER — LIDOCAINE 2% (20 MG/ML) 5 ML SYRINGE
INTRAMUSCULAR | Status: DC | PRN
  Administered 2016-01-11: 100 mg via INTRAVENOUS

## 2016-01-11 MED ORDER — BUPIVACAINE HCL (PF) 0.25 % IJ SOLN
INTRAMUSCULAR | Status: DC | PRN
  Administered 2016-01-11: 10 mL

## 2016-01-11 MED ORDER — ONDANSETRON HCL 4 MG/2ML IJ SOLN
INTRAMUSCULAR | Status: DC | PRN
  Administered 2016-01-11: 4 mg via INTRAVENOUS

## 2016-01-11 MED ORDER — OXYCODONE HCL 5 MG PO TABS
ORAL_TABLET | ORAL | Status: AC
  Filled 2016-01-11: qty 1

## 2016-01-11 MED ORDER — GLYCOPYRROLATE 0.2 MG/ML IJ SOLN: 0.2000 mg | Freq: Once | INTRAMUSCULAR | Status: DC | PRN

## 2016-01-11 MED ORDER — PROMETHAZINE HCL 25 MG/ML IJ SOLN: 6.2500 mg | INTRAMUSCULAR | Status: DC | PRN

## 2016-01-11 MED ORDER — LACTATED RINGERS IV SOLN
INTRAVENOUS | Status: DC
  Administered 2016-01-11 (×3): via INTRAVENOUS

## 2016-01-11 MED ORDER — DEXAMETHASONE SODIUM PHOSPHATE 10 MG/ML IJ SOLN
INTRAMUSCULAR | Status: DC | PRN
  Administered 2016-01-11: 10 mg via INTRAVENOUS

## 2016-01-11 MED ORDER — HYDROMORPHONE HCL 1 MG/ML IJ SOLN: 0.2500 mg | INTRAMUSCULAR | Status: DC | PRN

## 2016-01-11 MED ORDER — OXYCODONE HCL 5 MG PO TABS
5.0000 mg | ORAL_TABLET | Freq: Once | ORAL | Status: AC
Start: 2016-01-11 — End: 2016-01-11
  Administered 2016-01-11: 5 mg via ORAL

## 2016-01-11 MED ORDER — ONDANSETRON HCL 4 MG/2ML IJ SOLN
INTRAMUSCULAR | Status: AC
  Filled 2016-01-11: qty 2

## 2016-01-11 MED ORDER — BUPIVACAINE HCL (PF) 0.5 % IJ SOLN
INTRAMUSCULAR | Status: AC
  Filled 2016-01-11: qty 30

## 2016-01-11 MED ORDER — PROPOFOL 500 MG/50ML IV EMUL
INTRAVENOUS | Status: AC
  Filled 2016-01-11: qty 50

## 2016-01-11 MED ORDER — LIDOCAINE 2% (20 MG/ML) 5 ML SYRINGE
INTRAMUSCULAR | Status: AC
  Filled 2016-01-11: qty 5

## 2016-01-11 MED ORDER — LIDOCAINE HCL 2 % IJ SOLN
INTRAMUSCULAR | Status: AC
  Filled 2016-01-11: qty 20

## 2016-01-11 MED ORDER — FENTANYL CITRATE (PF) 100 MCG/2ML IJ SOLN
50.0000 ug | INTRAMUSCULAR | Status: DC | PRN
  Administered 2016-01-11: 25 ug via INTRAVENOUS
  Administered 2016-01-11: 100 ug via INTRAVENOUS

## 2016-01-11 MED ORDER — CEFAZOLIN SODIUM-DEXTROSE 2-4 GM/100ML-% IV SOLN
INTRAVENOUS | Status: AC
  Filled 2016-01-11: qty 100

## 2016-01-11 MED ORDER — EPHEDRINE SULFATE 50 MG/ML IJ SOLN
INTRAMUSCULAR | Status: DC | PRN
  Administered 2016-01-11 (×2): 10 mg via INTRAVENOUS

## 2016-01-11 MED ORDER — HEPARIN SODIUM (PORCINE) 1000 UNIT/ML IJ SOLN
INTRAMUSCULAR | Status: AC
  Filled 2016-01-11: qty 1

## 2016-01-11 MED ORDER — CHLORHEXIDINE GLUCONATE 4 % EX LIQD: 60.0000 mL | Freq: Once | CUTANEOUS | Status: DC

## 2016-01-11 SURGICAL SUPPLY — 71 items
APL SKNCLS STERI-STRIP NONHPOA (GAUZE/BANDAGES/DRESSINGS)
BAG DECANTER FOR FLEXI CONT (MISCELLANEOUS) IMPLANT
BANDAGE ACE 4X5 VEL STRL LF (GAUZE/BANDAGES/DRESSINGS) IMPLANT
BENZOIN TINCTURE PRP APPL 2/3 (GAUZE/BANDAGES/DRESSINGS) IMPLANT
BLADE MINI RND TIP GREEN BEAV (BLADE) ×3 IMPLANT
BLADE SURG 15 STRL LF DISP TIS (BLADE) ×2 IMPLANT
BLADE SURG 15 STRL SS (BLADE) ×4
BNDG CMPR 9X4 STRL LF SNTH (GAUZE/BANDAGES/DRESSINGS) ×2
BNDG COHESIVE 1X5 TAN STRL LF (GAUZE/BANDAGES/DRESSINGS) IMPLANT
BNDG ELASTIC 2X5.8 VLCR STR LF (GAUZE/BANDAGES/DRESSINGS) ×4 IMPLANT
BNDG ESMARK 4X9 LF (GAUZE/BANDAGES/DRESSINGS) ×3 IMPLANT
BNDG GAUZE ELAST 4 BULKY (GAUZE/BANDAGES/DRESSINGS) ×3 IMPLANT
CLOSURE WOUND 1/2 X4 (GAUZE/BANDAGES/DRESSINGS)
CORDS BIPOLAR (ELECTRODE) ×4 IMPLANT
COVER BACK TABLE 60X90IN (DRAPES) ×4 IMPLANT
CUFF TOURNIQUET SINGLE 18IN (TOURNIQUET CUFF) ×3 IMPLANT
DECANTER SPIKE VIAL GLASS SM (MISCELLANEOUS) IMPLANT
DRAPE EXTREMITY T 121X128X90 (DRAPE) ×4 IMPLANT
DRAPE SURG 17X23 STRL (DRAPES) ×4 IMPLANT
DURAPREP 26ML APPLICATOR (WOUND CARE) ×4 IMPLANT
GAUZE SPONGE 4X4 12PLY STRL (GAUZE/BANDAGES/DRESSINGS) ×4 IMPLANT
GAUZE SPONGE 4X4 16PLY XRAY LF (GAUZE/BANDAGES/DRESSINGS) IMPLANT
GAUZE XEROFORM 1X8 LF (GAUZE/BANDAGES/DRESSINGS) ×3 IMPLANT
GLOVE BIO SURGEON STRL SZ 6.5 (GLOVE) ×2 IMPLANT
GLOVE BIO SURGEONS STRL SZ 6.5 (GLOVE) ×1
GLOVE BIOGEL M STRL SZ7.5 (GLOVE) IMPLANT
GLOVE BIOGEL PI IND STRL 7.0 (GLOVE) ×1 IMPLANT
GLOVE BIOGEL PI INDICATOR 7.0 (GLOVE) ×2
GLOVE SURG SYN 8.0 (GLOVE) ×8 IMPLANT
GLOVE SURG SYN 8.0 PF PI (GLOVE) ×2 IMPLANT
GOWN STRL REUS W/ TWL LRG LVL3 (GOWN DISPOSABLE) ×2 IMPLANT
GOWN STRL REUS W/TWL LRG LVL3 (GOWN DISPOSABLE) ×4
GOWN STRL REUS W/TWL XL LVL3 (GOWN DISPOSABLE) ×12 IMPLANT
IV LACTATED RINGERS 500ML (IV SOLUTION) ×4 IMPLANT
NDL HYPO 25X1 1.5 SAFETY (NEEDLE) ×1 IMPLANT
NEEDLE HYPO 25X1 1.5 SAFETY (NEEDLE) ×4 IMPLANT
NS IRRIG 1000ML POUR BTL (IV SOLUTION) ×4 IMPLANT
PACK BASIN DAY SURGERY FS (CUSTOM PROCEDURE TRAY) ×4 IMPLANT
PAD CAST 3X4 CTTN HI CHSV (CAST SUPPLIES) ×2 IMPLANT
PADDING CAST ABS 4INX4YD NS (CAST SUPPLIES)
PADDING CAST ABS COTTON 4X4 ST (CAST SUPPLIES) ×1 IMPLANT
PADDING CAST COTTON 3X4 STRL (CAST SUPPLIES) ×4
PADDING UNDERCAST 2 STRL (CAST SUPPLIES)
PADDING UNDERCAST 2X4 STRL (CAST SUPPLIES) IMPLANT
PASSER SUT SWANSON 36MM LOOP (INSTRUMENTS) IMPLANT
SHEET MEDIUM DRAPE 40X70 STRL (DRAPES) ×4 IMPLANT
SLEEVE SCD COMPRESS KNEE MED (MISCELLANEOUS) ×3 IMPLANT
SPEAR EYE SURG WECK-CEL (MISCELLANEOUS) ×3 IMPLANT
SPLINT PLASTER CAST XFAST 4X15 (CAST SUPPLIES) ×30 IMPLANT
SPLINT PLASTER XTRA FAST SET 4 (CAST SUPPLIES) ×30
STOCKINETTE 4X48 STRL (DRAPES) ×4 IMPLANT
STRIP CLOSURE SKIN 1/2X4 (GAUZE/BANDAGES/DRESSINGS) IMPLANT
SUT ETHIBOND 3-0 V-5 (SUTURE) IMPLANT
SUT ETHILON 4 0 PS 2 18 (SUTURE) ×4 IMPLANT
SUT ETHILON 5 0 PS 2 18 (SUTURE) IMPLANT
SUT FIBERWIRE 3-0 18 TAPR NDL (SUTURE)
SUT FIBERWIRE 4-0 18 TAPR NDL (SUTURE) ×4
SUT NYLON 9 0 VRM6 (SUTURE) IMPLANT
SUT PROLENE 3 0 PS 2 (SUTURE) IMPLANT
SUT PROLENE 6 0 P 1 18 (SUTURE) ×3 IMPLANT
SUT VIC AB 4-0 RB1 27 (SUTURE) ×4
SUT VIC AB 4-0 RB1 27X BRD (SUTURE) ×1 IMPLANT
SUT VICRYL RAPIDE 4-0 (SUTURE) IMPLANT
SUT VICRYL RAPIDE 4/0 PS 2 (SUTURE) IMPLANT
SUTURE FIBERWR 3-0 18 TAPR NDL (SUTURE) IMPLANT
SUTURE FIBERWR 4-0 18 TAPR NDL (SUTURE) ×1 IMPLANT
SYR BULB 3OZ (MISCELLANEOUS) ×4 IMPLANT
SYRINGE 10CC LL (SYRINGE) IMPLANT
TOWEL OR 17X24 6PK STRL BLUE (TOWEL DISPOSABLE) ×4 IMPLANT
TUBE FEEDING 5FR 15 INCH (TUBING) IMPLANT
UNDERPAD 30X30 (UNDERPADS AND DIAPERS) ×4 IMPLANT

## 2016-01-11 NOTE — H&P (Signed)
Eugene Hardin is an 55 y.o. male.   Chief Complaint: right wrist pain and numbness HPI: as above s/p deep laceration to right wrist  Past Medical History:  Diagnosis Date  . Anxiety   . Back pain   . Chronic pain   . Depression   . GERD (gastroesophageal reflux disease)   . Hyperlipidemia   . Hypertension   . Neuropathy [facility](HCC)     Past Surgical History:  Procedure Laterality Date  . CARPAL TUNNEL RELEASE Left   . SHOULDER SURGERY Left     History reviewed. No pertinent family history. Social History:  reports that he has never smoked. He has never used smokeless tobacco. He reports that he drinks alcohol. He reports that he does not use drugs.  Allergies:  Allergies  Allergen Reactions  . Tramadol Anaphylaxis  . Septra [Sulfamethoxazole-Trimethoprim] Rash    Medications Prior to Admission  Medication Sig Dispense Refill  . ALPRAZolam (XANAX) 1 MG tablet Take 1 mg by mouth at bedtime as needed for anxiety.    Marland Kitchen. doxycycline (VIBRAMYCIN) 100 MG capsule Take 1 capsule (100 mg total) by mouth 2 (two) times daily. 14 capsule 0  . FLUoxetine (PROZAC) 10 MG tablet Take 10 mg by mouth 2 (two) times daily.     Marland Kitchen. gabapentin (NEURONTIN) 400 MG capsule Take 400 mg by mouth at bedtime.    Marland Kitchen. HYDROcodone-acetaminophen (NORCO/VICODIN) 5-325 MG per tablet Take 1 tablet by mouth every 6 (six) hours as needed for moderate pain.    Marland Kitchen. lansoprazole (PREVACID) 30 MG capsule Take 30 mg by mouth daily at 12 noon.    Marland Kitchen. lisinopril (PRINIVIL,ZESTRIL) 10 MG tablet Take 10 mg by mouth daily.    Marland Kitchen. loratadine (CLARITIN) 10 MG tablet Take 10 mg by mouth daily.    Marland Kitchen. lovastatin (MEVACOR) 20 MG tablet Take 20 mg by mouth daily.     . meloxicam (MOBIC) 7.5 MG tablet Take one tablet daily as needed for pain. Take with food      No results found for this or any previous visit (from the past 48 hour(s)). No results found.  Review of Systems  All other systems reviewed and are negative.   Blood  pressure (!) 147/83, pulse 71, temperature 98.4 F (36.9 C), temperature source Oral, resp. rate 20, height 5' 3.5" (1.613 m), weight 93.9 kg (207 lb), SpO2 100 %. Physical Exam  Constitutional: He is oriented to person, place, and time. He appears well-developed and well-nourished.  HENT:  Head: Normocephalic and atraumatic.  Neck: Normal range of motion.  Cardiovascular: Normal rate.   Respiratory: Effort normal.  Musculoskeletal:       Right wrist: He exhibits tenderness and laceration.  Right wrist dorsoradial laceration with decreased sensation  Neurological: He is alert and oriented to person, place, and time.  Skin: Skin is warm.  Psychiatric: He has a normal mood and affect. His behavior is normal. Judgment and thought content normal.     Assessment/Plan As above  Plan explore and repair as needed  Dairl PonderWEINGOLD,Synthia Fairbank A, MD 01/11/2016, 8:16 AM

## 2016-01-11 NOTE — Discharge Instructions (Signed)

## 2016-01-11 NOTE — Transfer of Care (Signed)
Immediate Anesthesia Transfer of Care Note  Patient: Eugene Hardin  Procedure(s) Performed: Procedure(s): RIGHT WRIST EXPLORATION, RIGHT WRIST NERVE, TENDON AND ARTERY  REPAIR (Right)  Patient Location: PACU  Anesthesia Type:General  Level of Consciousness: sedated  Airway & Oxygen Therapy: Patient Spontanous Breathing and Patient connected to face mask oxygen  Post-op Assessment: Report given to RN and Post -op Vital signs reviewed and stable  Post vital signs: Reviewed and stable  Last Vitals:  Vitals:   01/11/16 0729 01/11/16 0941  BP: (!) 147/83 (P) 103/60  Pulse: 71 63  Resp: 20 13  Temp: 36.9 C     Last Pain:  Vitals:   01/11/16 0729  TempSrc: Oral  PainSc:       Patients Stated Pain Goal: 2 (01/10/16 1031)  Complications: No apparent anesthesia complications

## 2016-01-11 NOTE — Anesthesia Postprocedure Evaluation (Signed)
Anesthesia Post Note  Patient: Eugene Hardin  Procedure(s) Performed: Procedure(s) (LRB): RIGHT WRIST EXPLORATION, RIGHT WRIST NERVE, TENDON AND ARTERY  REPAIR (Right)  Patient location during evaluation: PACU Anesthesia Type: General Level of consciousness: sedated and patient cooperative Pain management: pain level controlled Vital Signs Assessment: post-procedure vital signs reviewed and stable Respiratory status: spontaneous breathing Cardiovascular status: stable Anesthetic complications: no    Last Vitals:  Vitals:   01/11/16 1030 01/11/16 1122  BP: 129/65   Pulse: 92 70  Resp: 19 16  Temp:  37 C    Last Pain:  Vitals:   01/11/16 1122  TempSrc: Oral  PainSc: 2                  Lewie LoronJohn Bird Tailor

## 2016-01-11 NOTE — Op Note (Signed)
See note [id number]

## 2016-01-11 NOTE — Op Note (Signed)
NAME:  Eugene Hardin, Eugene Hardin            ACCOUNT NO.:  [id number]  MEDICAL RECORD NO.:  [id number]  LOCATION:                                 FACILITY:  PHYSICIAN:  Artist PaisMatthew A. Dyron Kawano, M.D.DATE OF BIRTH:  1961/04/18  DATE OF PROCEDURE:  01/11/2016 DATE OF DISCHARGE:                              OPERATIVE REPORT   PREOPERATIVE DIAGNOSIS:  Deep laceration, dorsal radial aspect, distal forearm and wrist area on the right.  POSTOPERATIVE DIAGNOSIS:  Deep laceration, dorsal radial aspect, distal forearm and wrist area on the right.  PROCEDURE:  Exploration above with neurolysis of superficial radial nerve, release of the edge of brachioradialis, repair of adductor pollicis longus tendon, and repair of cephalic vein.  SURGEON:  Artist PaisMatthew A. Mina MarbleWeingold, MD.  ASSISTANT:  None.  ANESTHESIA:  General.  COMPLICATION:  None.  DRAINS:  None.  DESCRIPTION OF PROCEDURE:  The patient was taken to the operating suite. After induction of adequate general anesthetic, right upper extremity was prepped and draped in sterile fashion.  An Esmarch was used to exsanguinate the limb.  Tourniquet was inflated to 250 mmHg.  At this point in time, a C-shaped laceration that was closed in the ER approximately 8 cm in the tip of the radial styloid over the dorsal radial aspect of the wrist was opened, removed all the sutures.  There was a central [place] of skin that was sutured proximally and distally. All sutures were removed.  We then extended it distally.  Dorsal and proximal volar flaps were raised.  Dissection was carried down to the area of the first dorsal compartment.  There was constriction of the superficial nerve with a bit of absorbable suture around the superficial radial nerve.  This was carefully removed.  There was a slight hourglass configuration to the nerve.  It was right at the leading edge of the brachioradialis.  We gently released brachioradialis to take pressure off the nerve.  We then  explored the first dorsal compartment.  There was a laceration in one of the adductor pollicis longus tendon slips. This was repaired with 4-0 FiberWire in horizontal mattress sutures x2 and a 6-0 Prolene epitendinous stitch.  There was a large branch of the cephalic vein that was lacerated.  We carefully removed the clot from the proximal and distal ends and tied at the ends and repaired the cephalic vein using 6-0 Prolene in a running locked suture.  The wound was then thoroughly irrigated.  There was a small bit of muscle overlying the first dorsal compartment that was repaired with a 4-0 Vicryl.  We then closed the skin with 4-0 nylon, Xeroform, 4 x 4s, fluffs, and a radial gutter splint was applied.  The patient tolerated all these procedures well, went to recovery room in stable fashion.     Artist PaisMatthew A. Mina MarbleWeingold, M.D.   ______________________________ Artist PaisMatthew A. Mina MarbleWeingold, M.D.    MAW/MEDQ  D:  01/11/2016  T:  01/11/2016  Job:  [id number]

## 2016-01-11 NOTE — Anesthesia Procedure Notes (Signed)
Procedure Name: LMA Insertion Date/Time: 01/11/2016 8:38 AM Performed by: Burna CashONRAD, Dorinda Stehr C Pre-anesthesia Checklist: Patient identified, Emergency Drugs available, Suction available and Patient being monitored Patient Re-evaluated:Patient Re-evaluated prior to inductionOxygen Delivery Method: Circle system utilized Preoxygenation: Pre-oxygenation with 100% oxygen Intubation Type: IV induction Ventilation: Mask ventilation without difficulty LMA: LMA inserted LMA Size: 5.0 Number of attempts: 1 Airway Equipment and Method: Bite block Placement Confirmation: positive ETCO2 Tube secured with: Tape Dental Injury: Teeth and Oropharynx as per pre-operative assessment

## 2016-01-12 ENCOUNTER — Encounter (HOSPITAL_BASED_OUTPATIENT_CLINIC_OR_DEPARTMENT_OTHER): Payer: Self-pay | Admitting: Orthopedic Surgery

## 2016-07-16 ENCOUNTER — Ambulatory Visit (HOSPITAL_COMMUNITY)
Admission: EM | Admit: 2016-07-16 | Discharge: 2016-07-16 | Disposition: A | Discharge status: Home / Self Care | Payer: Self-pay | Attending: Family Medicine | Admitting: Family Medicine

## 2016-07-16 ENCOUNTER — Encounter (HOSPITAL_COMMUNITY): Payer: Self-pay | Admitting: Family Medicine

## 2016-07-16 DIAGNOSIS — G43009 Migraine without aura, not intractable, without status migrainosus: Secondary | ICD-10-CM

## 2016-07-16 MED ORDER — KETOROLAC TROMETHAMINE 60 MG/2ML IM SOLN
INTRAMUSCULAR | Status: AC
  Filled 2016-07-16: qty 2

## 2016-07-16 MED ORDER — ONDANSETRON 4 MG PO TBDP
ORAL_TABLET | ORAL | Status: AC
  Filled 2016-07-16: qty 1

## 2016-07-16 MED ORDER — ONDANSETRON 4 MG PO TBDP: 4.0000 mg | ORAL_TABLET | Freq: Three times a day (TID) | ORAL | 0 refills | Status: DC | PRN

## 2016-07-16 MED ORDER — ONDANSETRON 4 MG PO TBDP
4.0000 mg | ORAL_TABLET | Freq: Once | ORAL | Status: AC
  Administered 2016-07-16: 4 mg via ORAL

## 2016-07-16 MED ORDER — KETOROLAC TROMETHAMINE 60 MG/2ML IM SOLN
60.0000 mg | Freq: Once | INTRAMUSCULAR | Status: AC
  Administered 2016-07-16: 60 mg via INTRAMUSCULAR

## 2016-07-16 MED ORDER — METHYLPREDNISOLONE 4 MG PO TBPK: ORAL_TABLET | ORAL | 0 refills | Status: DC

## 2016-07-16 NOTE — ED Provider Notes (Signed)
CSN: [id number]     Arrival date & time 07/16/16  1229 History   None    Chief Complaint  Patient presents with  . Headache   (Consider location/radiation/quality/duration/timing/severity/associated sxs/prior Treatment) Patient c/o headache that wraps around his head since last night.  He c/o phonophotophobia.  He c/o nausea.     The history is provided by the patient.  Headache  Pain location:  Generalized Quality:  Dull Severity currently:  7/10 Severity at highest:  9/10 Onset quality:  Sudden Duration:  1 day Timing:  Constant Chronicity:  New Similar to prior headaches: no   Context: bright light and loud noise   Relieved by:  None tried Worsened by:  Nothing Ineffective treatments:  None tried Associated symptoms: fatigue, nausea, neck pain and neck stiffness     Past Medical History:  Diagnosis Date  . Anxiety   . Back pain   . Chronic pain   . Depression   . GERD (gastroesophageal reflux disease)   . Hyperlipidemia   . Hypertension   . Neuropathy [facility])    Past Surgical History:  Procedure Laterality Date  . CARPAL TUNNEL RELEASE Left   . SHOULDER SURGERY Left   . WOUND EXPLORATION Right 01/11/2016   Procedure: RIGHT WRIST EXPLORATION, RIGHT WRIST NERVE, TENDON AND ARTERY  REPAIR;  Surgeon: Dairl Ponder, MD;  Location: [facility];  Service: Orthopedics;  Laterality: Right;   History reviewed. No pertinent family history. Social History  Substance Use Topics  . Smoking status: Never Smoker  . Smokeless tobacco: Never Used  . Alcohol use Yes     Comment: social    Review of Systems  Constitutional: Positive for fatigue.  HENT: Negative.   Eyes: Negative.   Respiratory: Negative.   Cardiovascular: Negative.   Gastrointestinal: Positive for nausea.  Genitourinary: Negative.   Musculoskeletal: Positive for neck pain and neck stiffness.  Neurological: Positive for headaches.  Hematological: Negative.   Psychiatric/Behavioral:  Negative.     Allergies  Tramadol and Septra [sulfamethoxazole-trimethoprim]  Home Medications   Prior to Admission medications   Medication Sig Start Date End Date Taking? Authorizing Provider  ALPRAZolam Prudy Feeler) 1 MG tablet Take 1 mg by mouth at bedtime as needed for anxiety.    Historical Provider, MD  doxycycline (VIBRAMYCIN) 100 MG capsule Take 1 capsule (100 mg total) by mouth 2 (two) times daily. 01/09/16   Derwood Kaplan, MD  FLUoxetine (PROZAC) 10 MG tablet Take 10 mg by mouth 2 (two) times daily.     Historical Provider, MD  gabapentin (NEURONTIN) 400 MG capsule Take 400 mg by mouth at bedtime.    Historical Provider, MD  HYDROcodone-acetaminophen (NORCO/VICODIN) 5-325 MG per tablet Take 1 tablet by mouth every 6 (six) hours as needed for moderate pain.    Historical Provider, MD  lansoprazole (PREVACID) 30 MG capsule Take 30 mg by mouth daily at 12 noon.    Historical Provider, MD  lisinopril (PRINIVIL,ZESTRIL) 10 MG tablet Take 10 mg by mouth daily.    Historical Provider, MD  loratadine (CLARITIN) 10 MG tablet Take 10 mg by mouth daily.    Historical Provider, MD  lovastatin (MEVACOR) 20 MG tablet Take 20 mg by mouth daily.     Historical Provider, MD  meloxicam (MOBIC) 7.5 MG tablet Take one tablet daily as needed for pain. Take with food 12/20/15 12/19/16  Historical Provider, MD  methylPREDNISolone (MEDROL DOSEPAK) 4 MG TBPK tablet Take 6-5-4-3-2-1 po qd 07/16/16   Deatra Canter,  FNP  ondansetron (ZOFRAN ODT) 4 MG disintegrating tablet Take 1 tablet (4 mg total) by mouth every 8 (eight) hours as needed for nausea or vomiting. 07/16/16   Deatra CanterWilliam J Creedon Danielski, FNP  oxyCODONE-acetaminophen (ROXICET) 5-325 MG tablet Take 1 tablet by mouth every 4 (four) hours as needed for severe pain. 01/11/16   Dairl PonderMatthew Weingold, MD   Meds Ordered and Administered this Visit   Medications  ketorolac (TORADOL) injection 60 mg (60 mg Intramuscular Given 07/16/16 1440)  ondansetron (ZOFRAN-ODT)  disintegrating tablet 4 mg (4 mg Oral Given 07/16/16 1440)    BP 181/100   Pulse 85   Temp 98.1 F (36.7 C)   Resp 18   SpO2 100%  No data found.   Physical Exam  Constitutional: He is oriented to person, place, and time. He appears well-developed and well-nourished.  HENT:  Head: Normocephalic and atraumatic.  Right Ear: External ear normal.  Left Ear: External ear normal.  Mouth/Throat: Oropharynx is clear and moist.  Eyes: Conjunctivae and EOM are normal. Pupils are equal, round, and reactive to light.  Neck: Normal range of motion. Neck supple.  Cardiovascular: Normal rate, regular rhythm and normal heart sounds.   Pulmonary/Chest: Effort normal and breath sounds normal.  Musculoskeletal:  Tender occipital and parietal scalp  Neurological: He is alert and oriented to person, place, and time.  Nursing note and vitals reviewed.   Urgent Care Course     Procedures (including critical care time)  Labs Review Labs Reviewed - No data to display  Imaging Review No results found.   Visual Acuity Review  Right Eye Distance:   Left Eye Distance:   Bilateral Distance:    Right Eye Near:   Left Eye Near:    Bilateral Near:         MDM   1. Migraine without aura and without status migrainosus, not intractable    toradol 60mg  IM Zofran ODT 4mg  one now Medrol Dose Pack as directed 4mg  #21 Zofran 4mg  one po tid prn #21  Push po fluids, take tylenol and motrin otc as directed, Continue with hydrocodone for moderate pain.      Deatra CanterWilliam J Ammanda Dobbins, FNP 07/16/16 [phone]

## 2016-07-16 NOTE — ED Triage Notes (Signed)
Pt here for headache that wraps around his head since last night. sts photophobia.

## 2016-12-06 ENCOUNTER — Ambulatory Visit (HOSPITAL_COMMUNITY)
Admission: EM | Admit: 2016-12-06 | Discharge: 2016-12-06 | Disposition: A | Discharge status: Home / Self Care | Payer: Self-pay | Attending: Family Medicine | Admitting: Family Medicine

## 2016-12-06 ENCOUNTER — Encounter (HOSPITAL_COMMUNITY): Payer: Self-pay | Admitting: Emergency Medicine

## 2016-12-06 DIAGNOSIS — L03115 Cellulitis of right lower limb: Secondary | ICD-10-CM

## 2016-12-06 MED ORDER — CEFTRIAXONE SODIUM 1 G IJ SOLR
INTRAMUSCULAR | Status: AC
  Filled 2016-12-06: qty 10

## 2016-12-06 MED ORDER — AMOXICILLIN-POT CLAVULANATE 875-125 MG PO TABS: 1.0000 | ORAL_TABLET | Freq: Two times a day (BID) | ORAL | 0 refills | Status: DC

## 2016-12-06 MED ORDER — CEFTRIAXONE SODIUM 1 G IJ SOLR
1.0000 g | Freq: Once | INTRAMUSCULAR | Status: AC
Start: 2016-12-06 — End: 2016-12-06
  Administered 2016-12-06: 1 g via INTRAMUSCULAR

## 2016-12-06 NOTE — ED Triage Notes (Signed)
PT reports infection on right foot. Unknown cause. PT has been taking "leftover" doxycycline since Monday. PT reports he has been taking 2 per day.

## 2016-12-06 NOTE — Discharge Instructions (Signed)
Warm compresses. Keep the wound on the foot clean with soap and water. Take the antibiotic as directed. If there is worsening in the next 24-36 hours, increased redness that is extending toward the ankle increased swelling, fevers or chills or red streaks go to the emergency department promptly. At that point you will likely need IV antibiotics. If it is not getting worse but also not getting better you will need to have a wound check and 24-36 hours.

## 2016-12-06 NOTE — ED Provider Notes (Addendum)
CSN: [id number]     Arrival date & time 12/06/16  1724 History   First MD Initiated Contact with Patient 12/06/16 1819     Chief Complaint  Patient presents with  . Cellulitis   (Consider location/radiation/quality/duration/timing/severity/associated sxs/prior Treatment) 56 year old male states that proximal one week ago he noticed a small pimple to the dorsum of the right foot below the great toe. He popped it. Shortly afterwards he developed surrounding erythema. Initially it was light superficial erythema that over the ensuing days he has developed a deeper erythema now extending just a little over half way up the foot to the dorsal and lateral aspect. There is a central wound that is shallow, about skin deep draining a honey-colored fluid.      Past Medical History:  Diagnosis Date  . Anxiety   . Back pain   . Chronic pain   . Depression   . GERD (gastroesophageal reflux disease)   . Hyperlipidemia   . Hypertension   . Neuropathy    Past Surgical History:  Procedure Laterality Date  . CARPAL TUNNEL RELEASE Left   . SHOULDER SURGERY Left   . WOUND EXPLORATION Right 01/11/2016   Procedure: RIGHT WRIST EXPLORATION, RIGHT WRIST NERVE, TENDON AND ARTERY  REPAIR;  Surgeon: Dairl Ponder, MD;  Location: [facility];  Service: Orthopedics;  Laterality: Right;   No family history on file. Social History  Substance Use Topics  . Smoking status: Never Smoker  . Smokeless tobacco: Never Used  . Alcohol use Yes     Comment: social    Review of Systems  Constitutional: Negative.   Skin: Positive for color change and wound.  Neurological: Negative.   All other systems reviewed and are negative.   Allergies  Tramadol and Septra [sulfamethoxazole-trimethoprim]  Home Medications   Prior to Admission medications   Medication Sig Start Date End Date Taking? Authorizing Provider  ALPRAZolam Prudy Feeler) 1 MG tablet Take 1 mg by mouth at bedtime as needed for anxiety.     [provider]  amoxicillin-clavulanate (AUGMENTIN) 875-125 MG tablet Take 1 tablet by mouth every 12 (twelve) hours. 12/06/16   Hayden Rasmussen, NP  doxycycline (VIBRAMYCIN) 100 MG capsule Take 1 capsule (100 mg total) by mouth 2 (two) times daily. 01/09/16   Derwood Kaplan, MD  FLUoxetine (PROZAC) 10 MG tablet Take 10 mg by mouth 2 (two) times daily.     [provider]  gabapentin (NEURONTIN) 400 MG capsule Take 400 mg by mouth at bedtime.    [provider]  HYDROcodone-acetaminophen (NORCO/VICODIN) 5-325 MG per tablet Take 1 tablet by mouth every 6 (six) hours as needed for moderate pain.    [provider]  lansoprazole (PREVACID) 30 MG capsule Take 30 mg by mouth daily at 12 noon.    [provider]  lisinopril (PRINIVIL,ZESTRIL) 10 MG tablet Take 10 mg by mouth daily.    [provider]  loratadine (CLARITIN) 10 MG tablet Take 10 mg by mouth daily.    [provider]  lovastatin (MEVACOR) 20 MG tablet Take 20 mg by mouth daily.     [provider]  meloxicam (MOBIC) 7.5 MG tablet Take one tablet daily as needed for pain. Take with food 12/20/15 12/19/16  [provider]  methylPREDNISolone (MEDROL DOSEPAK) 4 MG TBPK tablet Take 6-5-4-3-2-1 po qd 07/16/16   Deatra Canter, FNP  ondansetron (ZOFRAN ODT) 4 MG disintegrating tablet Take 1 tablet (4 mg total) by mouth every 8 (eight)  hours as needed for nausea or vomiting. 07/16/16   Deatra Canterxford, William J, FNP  oxyCODONE-acetaminophen (ROXICET) 5-325 MG tablet Take 1 tablet by mouth every 4 (four) hours as needed for severe pain. 01/11/16   Dairl PonderWeingold, Matthew, MD   Meds Ordered and Administered this Visit   Medications  cefTRIAXone (ROCEPHIN) injection 1 g (not administered)    BP (!) 153/89   Pulse 91   Temp 98.5 F (36.9 C) (Oral)   Resp 16   Ht 5\' 3"  (1.6 m)   Wt 212 lb (96.2 kg)   SpO2 97%   BMI 37.55 kg/m  No data found.   Physical Exam  Constitutional:  He is oriented to person, place, and time. He appears well-developed and well-nourished. No distress.  Neck: Neck supple.  Pulmonary/Chest: Effort normal.  Musculoskeletal: Normal range of motion. He exhibits edema.  Right foot with erythema and swelling primarily to the dorsal and lateral aspect. Erythema is dark red. There is a central wound approximately 2 cm in diameter draining from the dorsum. No lymphangitis is seen. No involvement of the ankle or higher. See photograph.  Neurological: He is alert and oriented to person, place, and time.  Skin: Skin is warm and dry.  As above.  Nursing note and vitals reviewed.   Urgent Care Course     Procedures (including critical care time)  Labs Review Labs Reviewed - No data to display  Imaging Review No results found.       Visual Acuity Review  Right Eye Distance:   Left Eye Distance:   Bilateral Distance:    Right Eye Near:   Left Eye Near:    Bilateral Near:         MDM   1. Cellulitis of right lower extremity    Warm compresses. Keep the wound on the foot clean with soap and water. Take the antibiotic as directed. If there is worsening in the next 24-36 hours, increased redness that is extending toward the ankle increased swelling, fevers or chills or red streaks go to the emergency department promptly. At that point you will likely need IV antibiotics. If it is not getting worse but also not getting better you will need to have a wound check and 24-36 hours. Meds ordered this encounter  Medications  . cefTRIAXone (ROCEPHIN) injection 1 g  . amoxicillin-clavulanate (AUGMENTIN) 875-125 MG tablet    Sig: Take 1 tablet by mouth every 12 (twelve) hours.    Dispense:  16 tablet    Refill:  0    Order Specific Question:   Supervising Provider    Answer:   Mardella LaymanHAGLER, BRIAN [[id number]]   Consulted with Dr. Tracie HarrierHagler.    Hayden RasmussenMabe, Zai Chmiel, NP 12/06/16 Pia [id number]    Venda Dice, NP 12/06/16 [phone]

## 2018-02-20 ENCOUNTER — Ambulatory Visit (HOSPITAL_COMMUNITY)
Admission: EM | Admit: 2018-02-20 | Discharge: 2018-02-20 | Disposition: A | Discharge status: Home / Self Care | Payer: Self-pay | Attending: Family Medicine | Admitting: Family Medicine

## 2018-02-20 ENCOUNTER — Encounter (HOSPITAL_COMMUNITY): Payer: Self-pay | Admitting: Emergency Medicine

## 2018-02-20 DIAGNOSIS — T1592XA Foreign body on external eye, part unspecified, left eye, initial encounter: Secondary | ICD-10-CM

## 2018-02-20 MED ORDER — GENTAMICIN SULFATE 0.3 % OP SOLN: 1.0000 [drp] | OPHTHALMIC | 0 refills | Status: DC

## 2018-02-20 NOTE — Discharge Instructions (Signed)
Follow-up with ophthalmology in a.m.

## 2018-02-20 NOTE — ED Triage Notes (Signed)
Pt here for foreign body to left eye x 2 days

## 2018-02-20 NOTE — ED Provider Notes (Signed)
[facility]    CSN: [id number] Arrival date & time: 02/20/18  1726     History   Chief Complaint Chief Complaint  Patient presents with  . Eye Pain    HPI Eugene Hardin is a 57 y.o. male.   Patient has foreign body in left eye x2 days.  He works under house in the duct work and not sure what foreign body is but thinks it might be a piece of metal.  Was seen by his PCP earlier today who did not have any anesthetic and sent him over here.  HPI  Past Medical History:  Diagnosis Date  . Anxiety   . Back pain   . Chronic pain   . Depression   . GERD (gastroesophageal reflux disease)   . Hyperlipidemia   . Hypertension   . Neuropathy     There are no active problems to display for this patient.   Past Surgical History:  Procedure Laterality Date  . CARPAL TUNNEL RELEASE Left   . SHOULDER SURGERY Left   . WOUND EXPLORATION Right 01/11/2016   Procedure: RIGHT WRIST EXPLORATION, RIGHT WRIST NERVE, TENDON AND ARTERY  REPAIR;  Surgeon: Dairl Ponder, MD;  Location: [facility];  Service: Orthopedics;  Laterality: Right;       Home Medications    Prior to Admission medications   Medication Sig Start Date End Date Taking? Authorizing Provider  ALPRAZolam Prudy Feeler) 1 MG tablet Take 1 mg by mouth at bedtime as needed for anxiety.    [provider]  amoxicillin-clavulanate (AUGMENTIN) 875-125 MG tablet Take 1 tablet by mouth every 12 (twelve) hours. 12/06/16   Hayden Rasmussen, NP  doxycycline (VIBRAMYCIN) 100 MG capsule Take 1 capsule (100 mg total) by mouth 2 (two) times daily. 01/09/16   Derwood Kaplan, MD  FLUoxetine (PROZAC) 10 MG tablet Take 10 mg by mouth 2 (two) times daily.     [provider]  gabapentin (NEURONTIN) 400 MG capsule Take 400 mg by mouth at bedtime.    [provider]  HYDROcodone-acetaminophen (NORCO/VICODIN) 5-325 MG per tablet Take 1 tablet by mouth every 6 (six) hours as needed for moderate pain.     [provider]  lansoprazole (PREVACID) 30 MG capsule Take 30 mg by mouth daily at 12 noon.    [provider]  lisinopril (PRINIVIL,ZESTRIL) 10 MG tablet Take 10 mg by mouth daily.    [provider]  loratadine (CLARITIN) 10 MG tablet Take 10 mg by mouth daily.    [provider]  lovastatin (MEVACOR) 20 MG tablet Take 20 mg by mouth daily.     [provider]  methylPREDNISolone (MEDROL DOSEPAK) 4 MG TBPK tablet Take 6-5-4-3-2-1 po qd 07/16/16   Deatra Canter, FNP  ondansetron (ZOFRAN ODT) 4 MG disintegrating tablet Take 1 tablet (4 mg total) by mouth every 8 (eight) hours as needed for nausea or vomiting. 07/16/16   Deatra Canter, FNP  oxyCODONE-acetaminophen (ROXICET) 5-325 MG tablet Take 1 tablet by mouth every 4 (four) hours as needed for severe pain. 01/11/16   Dairl Ponder, MD    Family History History reviewed. No pertinent family history.  Social History Social History   Tobacco Use  . Smoking status: Never Smoker  . Smokeless tobacco: Never Used  Substance Use Topics  . Alcohol use: Yes    Comment: social  . Drug use: No     Allergies   Tramadol and Septra [sulfamethoxazole-trimethoprim]  Review of Systems Review of Systems  Constitutional: Negative for chills and fever.  HENT: Negative for ear pain and sore throat.   Eyes: Positive for pain and redness. Negative for visual disturbance.  Respiratory: Negative for cough and shortness of breath.   Cardiovascular: Negative for chest pain and palpitations.  Gastrointestinal: Negative for abdominal pain and vomiting.  Genitourinary: Negative for dysuria and hematuria.  Musculoskeletal: Negative for arthralgias and back pain.  Skin: Negative for color change and rash.  Neurological: Negative for seizures and syncope.  All other systems reviewed and are negative.    Physical Exam Triage Vital Signs ED Triage Vitals [02/20/18 1808]  Enc Vitals Group     BP  (!) 178/102     Pulse Rate 76     Resp 18     Temp 98.3 F (36.8 C)     Temp Source Oral     SpO2 96 %     Weight      Height      Head Circumference      Peak Flow      Pain Score      Pain Loc      Pain Edu?      Excl. in GC?    No data found.  Updated Vital Signs BP (!) 178/102 (BP Location: Left Arm)   Pulse 76   Temp 98.3 F (36.8 C) (Oral)   Resp 18   SpO2 96%   Visual Acuity Right Eye Distance:   Left Eye Distance:   Bilateral Distance:    Right Eye Near:   Left Eye Near:    Bilateral Near:     Physical Exam  Constitutional: He appears well-developed and well-nourished.  Eyes:  Foreign body seen in the nasal portion of the iris medial to the pupil.  There is also a rust ring present.  I explained that we do not have the equipment to drill out the rust ring but he requested removal of the foreign body.  This was done with anesthetic and use of 18-gauge needle.  Foreign body was successfully removed.  He will be given some antibiotic drops.  He already has hydrocodone for pain.  He was given a number of ophthalmologist to follow-up for rest ring removal     UC Treatments / Results  Labs (all labs ordered are listed, but only abnormal results are displayed) Labs Reviewed - No data to display  EKG None  Radiology No results found.  Procedures Procedures (including critical care time)  Medications Ordered in UC Medications - No data to display  Initial Impression / Assessment and Plan / UC Course  I have reviewed the triage vital signs and the nursing notes.  Pertinent labs & imaging results that were available during my care of the patient were reviewed by me and considered in my medical decision making (see chart for details).     Foreign body, OS.  Follow-up with ophthalmologist. Final Clinical Impressions(s) / UC Diagnoses   Final diagnoses:  None   Discharge Instructions   None    ED Prescriptions    None     Controlled Substance  Prescriptions [place] Controlled Substance Registry consulted? No   Frederica Kuster, MD 02/20/18 1850

## 2019-05-25 ENCOUNTER — Other Ambulatory Visit: Payer: Self-pay

## 2019-10-30 ENCOUNTER — Other Ambulatory Visit: Payer: Self-pay | Admitting: Orthopedic Surgery

## 2019-12-15 NOTE — Patient Instructions (Signed)
DUE TO COVID-19 ONLY ONE VISITOR IS ALLOWED TO COME WITH YOU AND STAY IN THE WAITING ROOM ONLY DURING PRE OP AND PROCEDURE DAY OF SURGERY. THE 2 VISITORS MAY VISIT WITH YOU AFTER SURGERY IN YOUR PRIVATE ROOM DURING VISITING HOURS ONLY!  YOU NEED TO HAVE A COVID 19 TEST ON_7/29______ @_______ , THIS TEST MUST BE DONE BEFORE SURGERY, COME  [address], [place] , .  [facility]) , ONCE YOUR COVID TEST IS COMPLETED,  PLEASE BEGIN THE QUARANTINE INSTRUCTIONS AS OUTLINED IN YOUR HANDOUT.                Joban Colledge Aigner    Your procedure is scheduled on: 12/21/19   Report to [facility] Main  Entrance   Report to admitting at  5:30 AM     Call this number if you have problems the morning of surgery [phone]    BRUSH YOUR TEETH MORNING OF SURGERY AND RINSE YOUR MOUTH OUT, NO CHEWING GUM CANDY OR MINTS.   Do not eat food After Midnight.   YOU MAY HAVE CLEAR LIQUIDS FROM MIDNIGHT UNTIL 4:30AM  . At 4:30AM Please finish the prescribed Pre-Surgery  drink.   Nothing by mouth after you finish the  drink !   Take these medicines the morning of surgery with A SIP OF WATER: Prozac, Amlodipine, Prilosec, use eye drops and take painmed if needed                                 You may not have any metal on your body including              piercings  Do not wear jewelry,  lotions, powders or deodorant                       Men may shave face and neck.   Do not bring valuables to the hospital. [facility] IS NOT             RESPONSIBLE   FOR VALUABLES.  Contacts, dentures or bridgework may not be worn into surgery.       Patients discharged the day of surgery will not be allowed to drive home.   IF YOU ARE HAVING SURGERY AND GOING HOME THE SAME DAY, YOU MUST HAVE AN ADULT TO DRIVE YOU HOME AND BE WITH YOU FOR 24 HOURS.   YOU MAY GO HOME BY TAXI OR UBER OR ORTHERWISE, BUT AN ADULT MUST ACCOMPANY YOU HOME AND STAY WITH YOU FOR 24 HOURS.  Name and  phone number of your driver:  Special Instructions: N/A              Please read over the following fact sheets you were given: _____________________________________________________________________             [facility] - Preparing for Surgery Before surgery, you can play an important role.   Because skin is not sterile, your skin needs to be as free of germs as possible.   You can reduce the number of germs on your skin by washing with CHG (chlorahexidine gluconate) soap before surgery.   CHG is an antiseptic cleaner which kills germs and bonds with the skin to continue killing germs even after washing. Please DO NOT use if you have an allergy to CHG or antibacterial soaps.   If your skin becomes reddened/irritated stop using the  CHG and inform your nurse when you arrive at Short Stay.   You may shave your face/neck.  Please follow these instructions carefully:  1.  Shower with CHG Soap the night before surgery and the  morning of Surgery.  2.  If you choose to wash your hair, wash your hair first as usual with your  normal  shampoo.  3.  After you shampoo, rinse your hair and body thoroughly to remove the  shampoo.                                        4.  Use CHG as you would any other liquid soap.  You can apply chg directly  to the skin and wash                       Gently with a scrungie or clean washcloth.  5.  Apply the CHG Soap to your body ONLY FROM THE NECK DOWN.   Do not use on face/ open                           Wound or open sores. Avoid contact with eyes, ears mouth and genitals (private parts).                       Wash face,  Genitals (private parts) with your normal soap.             6.  Wash thoroughly, paying special attention to the area where your surgery  will be performed.  7.  Thoroughly rinse your body with warm water from the neck down.  8.  DO NOT shower/wash with your normal soap after using and rinsing off  the CHG Soap.             9.  Pat yourself  dry with a clean towel.            10.  Wear clean pajamas.            11.  Place clean sheets on your bed the night of your first shower and do not  sleep with pets. Day of Surgery : Do not apply any lotions/deodorants the morning of surgery.  Please wear clean clothes to the hospital/surgery center.  FAILURE TO FOLLOW THESE INSTRUCTIONS MAY RESULT IN THE CANCELLATION OF YOUR SURGERY PATIENT SIGNATURE_________________________________  NURSE SIGNATURE__________________________________  ________________________________________________________________________   Rogelia Mire  An incentive spirometer is a tool that can help keep your lungs clear and active. This tool measures how well you are filling your lungs with each breath. Taking long deep breaths may help reverse or decrease the chance of developing breathing (pulmonary) problems (especially infection) following:  A long period of time when you are unable to move or be active. BEFORE THE PROCEDURE   If the spirometer includes an indicator to show your best effort, your nurse or respiratory therapist will set it to a desired goal.  If possible, sit up straight or lean slightly forward. Try not to slouch.  Hold the incentive spirometer in an upright position. INSTRUCTIONS FOR USE  1. Sit on the edge of your bed if possible, or sit up as far as you can in bed or on a chair. 2. Hold the incentive spirometer in an upright position. 3. Breathe out normally.  4. Place the mouthpiece in your mouth and seal your lips tightly around it. 5. Breathe in slowly and as deeply as possible, raising the piston or the ball toward the top of the column. 6. Hold your breath for 3-5 seconds or for as long as possible. Allow the piston or ball to fall to the bottom of the column. 7. Remove the mouthpiece from your mouth and breathe out normally. 8. Rest for a few seconds and repeat Steps 1 through 7 at least 10 times every 1-2 hours when you are  awake. Take your time and take a few normal breaths between deep breaths. 9. The spirometer may include an indicator to show your best effort. Use the indicator as a goal to work toward during each repetition. 10. After each set of 10 deep breaths, practice coughing to be sure your lungs are clear. If you have an incision (the cut made at the time of surgery), support your incision when coughing by placing a pillow or rolled up towels firmly against it. Once you are able to get out of bed, walk around indoors and cough well. You may stop using the incentive spirometer when instructed by your caregiver.  RISKS AND COMPLICATIONS  Take your time so you do not get dizzy or light-headed.  If you are in pain, you may need to take or ask for pain medication before doing incentive spirometry. It is harder to take a deep breath if you are having pain. AFTER USE  Rest and breathe slowly and easily.  It can be helpful to keep track of a log of your progress. Your caregiver can provide you with a simple table to help with this. If you are using the spirometer at home, follow these instructions: SEEK MEDICAL CARE IF:   You are having difficultly using the spirometer.  You have trouble using the spirometer as often as instructed.  Your pain medication is not giving enough relief while using the spirometer.  You develop fever of 100.5 F (38.1 C) or higher. SEEK IMMEDIATE MEDICAL CARE IF:   You cough up bloody sputum that had not been present before.  You develop fever of 102 F (38.9 C) or greater.  You develop worsening pain at or near the incision site. MAKE SURE YOU:   Understand these instructions.  Will watch your condition.  Will get help right away if you are not doing well or get worse. Document Released: 09/17/2006 Document Revised: 07/30/2011 Document Reviewed: 11/18/2006 [employer] Patient Information 2014 [place],  [place].   ________________________________________________________________________

## 2019-12-16 ENCOUNTER — Encounter (HOSPITAL_COMMUNITY)
Admission: RE | Admit: 2019-12-16 | Discharge: 2019-12-16 | Disposition: A | Discharge status: Home / Self Care | Payer: Self-pay | Source: Ambulatory Visit | Attending: Orthopedic Surgery | Admitting: Orthopedic Surgery

## 2019-12-21 ENCOUNTER — Ambulatory Visit (HOSPITAL_COMMUNITY): Admission: RE | Admit: 2019-12-21 | Payer: Self-pay | Source: Home / Self Care | Admitting: Orthopedic Surgery

## 2019-12-21 ENCOUNTER — Encounter (HOSPITAL_COMMUNITY): Admission: RE | Payer: Self-pay | Source: Home / Self Care

## 2019-12-21 SURGERY — ARTHROPLASTY, KNEE, TOTAL
Anesthesia: Spinal | Site: Knee | Laterality: Right

## 2020-05-09 ENCOUNTER — Other Ambulatory Visit: Payer: Self-pay

## 2020-05-09 ENCOUNTER — Ambulatory Visit (INDEPENDENT_AMBULATORY_CARE_PROVIDER_SITE_OTHER): Payer: Self-pay | Admitting: Orthopaedic Surgery

## 2020-05-09 VITALS — Ht 63.0 in | Wt 215.0 lb

## 2020-05-09 DIAGNOSIS — M1711 Unilateral primary osteoarthritis, right knee: Secondary | ICD-10-CM | POA: Insufficient documentation

## 2020-05-09 DIAGNOSIS — M25562 Pain in left knee: Secondary | ICD-10-CM

## 2020-05-09 DIAGNOSIS — M25561 Pain in right knee: Secondary | ICD-10-CM

## 2020-05-09 DIAGNOSIS — G8929 Other chronic pain: Secondary | ICD-10-CM

## 2020-05-09 NOTE — Progress Notes (Signed)
Office Visit Note   Patient: Eugene Hardin           Date of Birth: [birth year]           MRN: [id number] Visit Date: 05/09/2020              Requested by: Blair Heys, MD [address] [place],  [place] [address] PCP: Blair Heys, MD   Assessment & Plan: Visit Diagnoses:  1. Chronic pain of left knee   2. Chronic pain of right knee   3. Unilateral primary osteoarthritis, right knee     Plan: Based on his clinical exam and x-rays I agree with the need for a right total knee arthroplasty.  I gave him our surgery scheduler's card.  Our hands are tied until they go through the [facility] financial assistance program.  At that point we will have to wait to see if he becomes someone who qualifies for [facility]'s financial assistance so we can schedule the surgery appropriately.  Follow-Up Instructions: Return if symptoms worsen or fail to improve.   Orders:  No orders of the defined types were placed in this encounter.  No orders of the defined types were placed in this encounter.     Procedures: No procedures performed   Clinical Data: No additional findings.   Subjective: Chief Complaint  Patient presents with  . Left Knee - Pain  . Right Knee - Pain  The patient is someone I am seeing for the first time.  He has known severe end-stage arthritis of his right knee.  He was actually scheduled for knee replacement surgery earlier this year by one of my colleagues in town.  The surgery was canceled because the patient had no insurance and no financial means.  His wife is with him today.  He has severe knee pain is been hurting for many years now.  He has tried and failed other forms conservative treatment.  X-rays that accompany him also shows severe end-stage arthritis of the right knee.  They are still working through the [employer] system trying to get financial assistance.  They have not been successful as of yet apparently.  His knee pain is daily and it is 10 out of  10 with the right knee but he also has left knee pain and instability symptoms of the left knee.  He says he injured that left knee sometime ago.  He denies any other acute active medical issues.  HPI  Review of Systems He currently denies any headache, chest pain, shortness of breath, fever, chills, nausea, vomiting  Objective: Vital Signs: Ht 5\' 3"  (1.6 m)   Wt 215 lb (97.5 kg)   BMI 38.09 kg/m   Physical Exam He is alert and oriented x3 and in no acute distress Ortho Exam Examination of his right knee does show varus malalignment where his left knee is straighter.  His right knee has significant medial joint line tenderness.  The varus malalignment is correctable.  There is significant patellofemoral arthritic changes.  There is pain throughout the arc of motion of his right knee.  His left knee shows no effusion and is ligamentously stable with painful throughout its arc of motion. Specialty Comments:  No specialty comments available.  Imaging: No results found. X-rays that accompany him of the right knee show severe end-stage arthritis of the right knee.  There is varus malalignment and complete loss of medial joint space.  There is also significant patellofemoral disease.  The  AP view of the left knee still shows some maintenance of space between the medial lateral compartments.  PMFS History: Patient Active Problem List   Diagnosis Date Noted  . Unilateral primary osteoarthritis, right knee 05/09/2020   Past Medical History:  Diagnosis Date  . Anxiety   . Back pain   . Chronic pain   . Depression   . GERD (gastroesophageal reflux disease)   . Hyperlipidemia   . Hypertension   . Neuropathy     No family history on file.  Past Surgical History:  Procedure Laterality Date  . CARPAL TUNNEL RELEASE Left   . SHOULDER SURGERY Left   . WOUND EXPLORATION Right 01/11/2016   Procedure: RIGHT WRIST EXPLORATION, RIGHT WRIST NERVE, TENDON AND ARTERY  REPAIR;  Surgeon: Dairl Ponder, MD;  Location: [facility];  Service: Orthopedics;  Laterality: Right;   Social History   Occupational History  . Not on file  Tobacco Use  . Smoking status: Never Smoker  . Smokeless tobacco: Never Used  Substance and Sexual Activity  . Alcohol use: Yes    Comment: social  . Drug use: No  . Sexual activity: Not on file

## 2020-10-03 ENCOUNTER — Other Ambulatory Visit: Payer: Self-pay | Admitting: Family Medicine

## 2020-10-03 DIAGNOSIS — R748 Abnormal levels of other serum enzymes: Secondary | ICD-10-CM

## 2020-10-04 ENCOUNTER — Other Ambulatory Visit: Payer: Self-pay | Admitting: Family Medicine

## 2020-10-04 ENCOUNTER — Other Ambulatory Visit (HOSPITAL_COMMUNITY): Payer: Self-pay | Admitting: Family Medicine

## 2020-10-04 DIAGNOSIS — R1084 Generalized abdominal pain: Secondary | ICD-10-CM

## 2020-10-04 DIAGNOSIS — K625 Hemorrhage of anus and rectum: Secondary | ICD-10-CM

## 2020-10-07 ENCOUNTER — Ambulatory Visit (HOSPITAL_COMMUNITY): Payer: Self-pay

## 2020-10-13 ENCOUNTER — Other Ambulatory Visit: Payer: Self-pay

## 2020-10-13 ENCOUNTER — Encounter (INDEPENDENT_AMBULATORY_CARE_PROVIDER_SITE_OTHER): Payer: Self-pay

## 2020-10-13 ENCOUNTER — Ambulatory Visit (HOSPITAL_COMMUNITY)
Admission: RE | Admit: 2020-10-13 | Discharge: 2020-10-13 | Disposition: A | Discharge status: Home / Self Care | Payer: Self-pay | Source: Ambulatory Visit | Attending: Family Medicine | Admitting: Family Medicine

## 2020-10-13 DIAGNOSIS — R1084 Generalized abdominal pain: Secondary | ICD-10-CM

## 2020-10-13 DIAGNOSIS — K625 Hemorrhage of anus and rectum: Secondary | ICD-10-CM

## 2020-10-13 MED ORDER — IOHEXOL 300 MG/ML  SOLN
100.0000 mL | Freq: Once | INTRAMUSCULAR | Status: AC | PRN
  Administered 2020-10-13: 100 mL via INTRAVENOUS

## 2020-10-13 MED ORDER — SODIUM CHLORIDE (PF) 0.9 % IJ SOLN
INTRAMUSCULAR | Status: AC
  Filled 2020-10-13: qty 50

## 2020-10-24 ENCOUNTER — Other Ambulatory Visit: Payer: Self-pay

## 2020-11-08 ENCOUNTER — Other Ambulatory Visit: Payer: Self-pay

## 2020-11-08 ENCOUNTER — Emergency Department (HOSPITAL_COMMUNITY)
Admission: EM | Admit: 2020-11-08 | Discharge: 2020-11-08 | Disposition: A | Discharge status: Home / Self Care | Payer: Self-pay | Attending: Emergency Medicine | Admitting: Emergency Medicine

## 2020-11-08 ENCOUNTER — Emergency Department (HOSPITAL_COMMUNITY): Payer: Self-pay

## 2020-11-08 ENCOUNTER — Encounter: Payer: Self-pay | Admitting: Emergency Medicine

## 2020-11-08 ENCOUNTER — Encounter (HOSPITAL_COMMUNITY): Payer: Self-pay | Admitting: *Deleted

## 2020-11-08 ENCOUNTER — Ambulatory Visit
Admission: EM | Admit: 2020-11-08 | Discharge: 2020-11-08 | Disposition: A | Discharge status: Home / Self Care | Payer: Self-pay | Attending: Family Medicine | Admitting: Family Medicine

## 2020-11-08 DIAGNOSIS — Z1152 Encounter for screening for COVID-19: Secondary | ICD-10-CM

## 2020-11-08 DIAGNOSIS — Z7982 Long term (current) use of aspirin: Secondary | ICD-10-CM | POA: Insufficient documentation

## 2020-11-08 DIAGNOSIS — Z79899 Other long term (current) drug therapy: Secondary | ICD-10-CM | POA: Insufficient documentation

## 2020-11-08 DIAGNOSIS — E876 Hypokalemia: Secondary | ICD-10-CM | POA: Insufficient documentation

## 2020-11-08 DIAGNOSIS — I10 Essential (primary) hypertension: Secondary | ICD-10-CM | POA: Insufficient documentation

## 2020-11-08 DIAGNOSIS — J189 Pneumonia, unspecified organism: Secondary | ICD-10-CM | POA: Insufficient documentation

## 2020-11-08 DIAGNOSIS — R0902 Hypoxemia: Secondary | ICD-10-CM

## 2020-11-08 DIAGNOSIS — Z20822 Contact with and (suspected) exposure to covid-19: Secondary | ICD-10-CM | POA: Insufficient documentation

## 2020-11-08 DIAGNOSIS — R0602 Shortness of breath: Secondary | ICD-10-CM

## 2020-11-08 LAB — BASIC METABOLIC PANEL
Anion gap: 10 (ref 5–15)
BUN: 10 mg/dL (ref 6–20)
CO2: 31 mmol/L (ref 22–32)
Calcium: 9.3 mg/dL (ref 8.9–10.3)
Chloride: 98 mmol/L (ref 98–111)
Creatinine, Ser: 0.93 mg/dL (ref 0.61–1.24)
GFR, Estimated: >60 mL/min (ref >60)
Glucose, Bld: 113 mg/dL — ABNORMAL HIGH (ref 70–99)
Potassium: 2.9 mmol/L — ABNORMAL LOW (ref 3.5–5.1)
Sodium: 139 mmol/L (ref 135–145)

## 2020-11-08 LAB — CBC WITH DIFFERENTIAL/PLATELET
Abs Immature Granulocytes: 0.01 10*3/uL (ref 0.00–0.07)
Basophils Absolute: 0 10*3/uL (ref 0.0–0.1)
Basophils Relative: 1 %
Eosinophils Absolute: 0.2 10*3/uL (ref 0.0–0.5)
Eosinophils Relative: 4 %
HCT: 42.2 % (ref 39.0–52.0)
Hemoglobin: 14.8 g/dL (ref 13.0–17.0)
Immature Granulocytes: 0 %
Lymphocytes Relative: 25 %
Lymphs Abs: 1 10*3/uL (ref 0.7–4.0)
MCH: 32.8 pg (ref 26.0–34.0)
MCHC: 35.1 g/dL (ref 30.0–36.0)
MCV: 93.6 fL (ref 80.0–100.0)
Monocytes Absolute: 0.3 10*3/uL (ref 0.1–1.0)
Monocytes Relative: 9 %
Neutro Abs: 2.3 10*3/uL (ref 1.7–7.7)
Neutrophils Relative %: 61 %
Platelets: 144 10*3/uL — ABNORMAL LOW (ref 150–400)
RBC: 4.51 MIL/uL (ref 4.22–5.81)
RDW: 13.2 % (ref 11.5–15.5)
WBC: 3.8 10*3/uL — ABNORMAL LOW (ref 4.0–10.5)
nRBC: 0 % (ref 0.0–0.2)

## 2020-11-08 LAB — RESP PANEL BY RT-PCR (FLU A&B, COVID) ARPGX2
Influenza A by PCR: NEGATIVE
Influenza B by PCR: NEGATIVE
SARS Coronavirus 2 by RT PCR: NEGATIVE

## 2020-11-08 LAB — BRAIN NATRIURETIC PEPTIDE: B Natriuretic Peptide: 11 pg/mL (ref 0.0–100.0)

## 2020-11-08 MED ORDER — DOXYCYCLINE HYCLATE 100 MG PO TABS
100.0000 mg | ORAL_TABLET | Freq: Once | ORAL | Status: AC
  Administered 2020-11-08: 100 mg via ORAL
  Filled 2020-11-08: qty 1

## 2020-11-08 MED ORDER — POTASSIUM CHLORIDE CRYS ER 20 MEQ PO TBCR
40.0000 meq | EXTENDED_RELEASE_TABLET | Freq: Once | ORAL | Status: AC
  Administered 2020-11-08: 40 meq via ORAL
  Filled 2020-11-08: qty 2

## 2020-11-08 MED ORDER — DOXYCYCLINE HYCLATE 100 MG PO CAPS: 100.0000 mg | ORAL_CAPSULE | Freq: Two times a day (BID) | ORAL | 0 refills | Status: DC

## 2020-11-08 MED ORDER — ALBUTEROL SULFATE HFA 108 (90 BASE) MCG/ACT IN AERS
2.0000 | INHALATION_SPRAY | RESPIRATORY_TRACT | Status: DC | PRN
  Administered 2020-11-08: 2 via RESPIRATORY_TRACT
  Filled 2020-11-08: qty 6.7

## 2020-11-08 MED ORDER — AEROCHAMBER PLUS FLO-VU MEDIUM MISC
1.0000 | Freq: Once | Status: AC
  Administered 2020-11-08: 1

## 2020-11-08 MED ORDER — ALBUTEROL SULFATE HFA 108 (90 BASE) MCG/ACT IN AERS
2.0000 | INHALATION_SPRAY | RESPIRATORY_TRACT | Status: DC
  Administered 2020-11-08: 2 via RESPIRATORY_TRACT

## 2020-11-08 NOTE — Discharge Instructions (Addendum)
Go immediately to [facility] Emergency Department for further work-up and evaluation of shortness of breath. Concern for possible COVID given the abrupt onset of your symptoms

## 2020-11-08 NOTE — ED Triage Notes (Signed)
Pt has been having sob with a bad cough and congestion x 1 week. Today his sats are 88% and pt said her feels terrible and can't catch his breath. Some fevers off and on.

## 2020-11-08 NOTE — Discharge Instructions (Addendum)
Take antibiotics as prescribed, you will receive your first dose in the ER before you leave. Return for worsening shortness of breath or new concerns.  Your COVID test was negative. Take your albuterol inhaler home with you, use it every 3-4 hours as needed for wheezing or shortness of breath.

## 2020-11-08 NOTE — ED Provider Notes (Signed)
[facility] EMERGENCY DEPARTMENT Provider Note   CSN: [id number] Arrival date & time: 11/08/20  1718     History Chief Complaint  Patient presents with   Cough    Eugene Hardin is a 60 y.o. male.  Patient presents with history of reflux, high blood pressure and has had worsening cough and shortness of breath for the past week.  No significant sick contacts known.  No known COVID contacts.  No lung or heart disease or heart failure known.  No history of blood clots, no recent surgeries.  Subjective fevers.  Patient sent from urgent care for oxygen saturations 88%.      Past Medical History:  Diagnosis Date   Anxiety    Back pain    Chronic pain    Depression    GERD (gastroesophageal reflux disease)    Hyperlipidemia    Hypertension    Neuropathy     Patient Active Problem List   Diagnosis Date Noted   Unilateral primary osteoarthritis, right knee 05/09/2020    Past Surgical History:  Procedure Laterality Date   CARPAL TUNNEL RELEASE Left    SHOULDER SURGERY Left    WOUND EXPLORATION Right 01/11/2016   Procedure: RIGHT WRIST EXPLORATION, RIGHT WRIST NERVE, TENDON AND ARTERY  REPAIR;  Surgeon: Dairl Ponder, MD;  Location: [facility];  Service: Orthopedics;  Laterality: Right;       History reviewed. No pertinent family history.  Social History   Tobacco Use   Smoking status: Never   Smokeless tobacco: Never  Substance Use Topics   Alcohol use: Yes    Comment: social   Drug use: No    Home Medications Prior to Admission medications   Medication Sig Start Date End Date Taking? Authorizing Provider  ALLERGY RELIEF/NASAL DECONGEST 10-240 MG 24 hr tablet Take 1 tablet by mouth daily. 10/13/20  Yes [provider]  ALPRAZolam Prudy Feeler) 1 MG tablet Take 1 mg by mouth at bedtime.   Yes [provider]  amLODipine (NORVASC) 5 MG tablet Take 5 mg by mouth daily. 11/11/19  Yes [provider]  ascorbic acid (VITAMIN  C) 500 MG tablet Take 500 mg by mouth daily.   Yes [provider]  aspirin EC 81 MG tablet Take 81 mg by mouth at bedtime. Swallow whole.   Yes [provider]  atorvastatin (LIPITOR) 40 MG tablet Take 40 mg by mouth every evening. 09/23/19  Yes [provider]  azelastine (ASTELIN) 0.1 % nasal spray Place 2 sprays into both nostrils 2 (two) times daily as needed for allergies. 09/18/19  Yes [provider]  B Complex-C (B-COMPLEX WITH VITAMIN C) tablet Take 1 tablet by mouth daily.   Yes [provider]  Cyanocobalamin (VITAMIN B-12) 2500 MCG SUBL Take 2,500 mcg by mouth daily.   Yes [provider]  doxycycline (VIBRAMYCIN) 100 MG capsule Take 1 capsule (100 mg total) by mouth 2 (two) times daily. One po bid x 7 days 11/08/20  Yes Blane Ohara, MD  FLUoxetine (PROZAC) 10 MG capsule Take 10 mg by mouth 2 (two) times daily. 09/23/19  Yes [provider]  gabapentin (NEURONTIN) 400 MG capsule Take 400 mg by mouth at bedtime.   Yes [provider]  HYDROcodone-acetaminophen (NORCO/VICODIN) 5-325 MG per tablet Take 1 tablet by mouth every 6 (six) hours as needed for moderate pain.   Yes [provider]  meloxicam (MOBIC) 15 MG tablet Take 15 mg by mouth daily. 11/12/19  Yes [provider]  omeprazole (PRILOSEC) 20 MG capsule Take 40 mg by mouth daily before breakfast. 11/12/19  Yes [provider]  vitamin E 45 MG (100 UNITS) capsule Take 100 Units by mouth daily.   Yes [provider]    Allergies    Tramadol and Septra [sulfamethoxazole-trimethoprim]  Review of Systems   Review of Systems  Constitutional:  Positive for fever. Negative for chills.  HENT:  Positive for congestion.   Eyes:  Negative for visual disturbance.  Respiratory:  Positive for cough and shortness of breath.   Cardiovascular:  Negative for chest pain.  Gastrointestinal:  Negative for abdominal pain and vomiting.   Genitourinary:  Negative for dysuria and flank pain.  Musculoskeletal:  Negative for back pain, neck pain and neck stiffness.  Skin:  Negative for rash.  Neurological:  Negative for light-headedness and headaches.   Physical Exam Updated Vital Signs BP (!) 153/96 (BP Location: Right Arm)   Pulse 89   Temp 98.5 F (36.9 C) (Temporal)   Resp 20   Ht 5\' 3"  (1.6 m)   Wt 97.5 kg   SpO2 92%   BMI 38.09 kg/m   Physical Exam Vitals and nursing note reviewed.  Constitutional:      General: He is not in acute distress.    Appearance: He is well-developed.  HENT:     Head: Normocephalic and atraumatic.     Mouth/Throat:     Mouth: Mucous membranes are dry.  Eyes:     General:        Right eye: No discharge.        Left eye: No discharge.     Conjunctiva/sclera: Conjunctivae normal.  Neck:     Trachea: No tracheal deviation.  Cardiovascular:     Rate and Rhythm: Normal rate and regular rhythm.     Heart sounds: No murmur heard. Pulmonary:     Effort: Pulmonary effort is normal.     Breath sounds: Rhonchi and rales present.  Abdominal:     General: There is no distension.     Palpations: Abdomen is soft.     Tenderness: There is no abdominal tenderness. There is no guarding.  Musculoskeletal:        General: No swelling. Normal range of motion.     Cervical back: Normal range of motion and neck supple. No rigidity.  Skin:    General: Skin is warm.     Capillary Refill: Capillary refill takes less than 2 seconds.     Findings: No rash.  Neurological:     General: No focal deficit present.     Mental Status: He is alert.     Cranial Nerves: No cranial nerve deficit.  Psychiatric:        Mood and Affect: Mood normal.    ED Results / Procedures / Treatments   Labs (all labs ordered are listed, but only abnormal results are displayed) Labs Reviewed  CBC WITH DIFFERENTIAL/PLATELET - Abnormal; Notable for the following components:      Result Value   WBC 3.8 (*)     Platelets 144 (*)    All other components within normal limits  BASIC METABOLIC PANEL - Abnormal; Notable for the following components:   Potassium 2.9 (*)    Glucose, Bld 113 (*)    All other components within normal limits  RESP PANEL BY RT-PCR (FLU A&B, COVID) ARPGX2  BRAIN NATRIURETIC PEPTIDE    EKG None  Radiology DG Chest Portable  1 View  Result Date: 11/08/2020 CLINICAL DATA:  Cough and fevers EXAM: PORTABLE CHEST 1 VIEW COMPARISON:  08/05/2007 FINDINGS: Cardiac shadow is within normal limits. The lungs are well aerated bilaterally. No focal infiltrate or sizable effusion is seen. No acute bony abnormality is seen. IMPRESSION: No acute abnormality noted. Electronically Signed   By: Alcide Clever M.D.   On: 11/08/2020 18:05    Procedures Procedures   Medications Ordered in ED Medications  albuterol (VENTOLIN HFA) 108 (90 Base) MCG/ACT inhaler 2 puff (2 puffs Inhalation Given 11/08/20 2136)  doxycycline (VIBRA-TABS) tablet 100 mg (has no administration in time range)  potassium chloride SA (KLOR-CON) CR tablet 40 mEq (40 mEq Oral Given 11/08/20 2136)    ED Course  I have reviewed the triage vital signs and the nursing notes.  Pertinent labs & imaging results that were available during my care of the patient were reviewed by me and considered in my medical decision making (see chart for details).    MDM Rules/Calculators/A&P                          Patient presents with clinical concern for pneumonia [place] viral/COVID/flu versus bacterial.  Patient does have bilateral rales on exam, oxygen saturation 91% on room air in the room.  Plan for albuterol as needed inhaler, COVID/flu testing.  Chest x-ray reviewed no obvious infiltrate.  Concern for pneumonia based on exam.  General blood work ordered, oral fluids. Patient's blood work reassuring normal white blood cell count, normal hemoglobin, potassium mild decreased 2.9, oral potassium given. Fortunately patient's breathing  improved in the ER not requiring oxygen, 92 to 93% range.  Concern clinically for pneumonia, COVID test negative.  Doxycycline ordered first dose and prescription for home. Platelets mild low 144.  Eugene Hardin was evaluated in Emergency Department on 11/08/2020 for the symptoms described in the history of present illness. He was evaluated in the context of the global COVID-19 pandemic, which necessitated consideration that the patient might be at risk for infection with the SARS-CoV-2 virus that causes COVID-19. Institutional protocols and algorithms that pertain to the evaluation of patients at risk for COVID-19 are in a state of rapid change based on information released by regulatory bodies including the CDC and federal and state organizations. These policies and algorithms were followed during the patient's care in the ED.   Final Clinical Impression(s) / ED Diagnoses Final diagnoses:  Community acquired pneumonia, unspecified laterality  Hypokalemia    Rx / DC Orders ED Discharge Orders          Ordered    doxycycline (VIBRAMYCIN) 100 MG capsule  2 times daily        11/08/20 2150             Blane Ohara, MD 11/08/20 2153

## 2020-11-08 NOTE — ED Notes (Signed)
Pt was advised to go to the er for further eval due to SOB

## 2020-11-08 NOTE — ED Notes (Signed)
covid test discarded; pt will be going to er per provider

## 2020-11-08 NOTE — ED Triage Notes (Signed)
Pt with sob and cough for a week.  Urgent Care sent here due to sats were 88%.  RA sats noted at 90% currently in triage. Fevers off and on.

## 2020-11-08 NOTE — ED Triage Notes (Signed)
Unable to obtain oral temp due to pt drinking in triage.   Also states hernia has gotten worse with his coughing.

## 2020-11-08 NOTE — ED Provider Notes (Signed)
[facility]    CSN: [id number] Arrival date & time: 11/08/20  1601      History   Chief Complaint Chief Complaint  Patient presents with   Cough   Fever    HPI Eugene Hardin is a 60 y.o. male.   HPI Patient  presents today for evaluation of 1 week of worsening shortness of breath. He is also had a cough which has been occasionally productive.  He reports his wife was sick with an upper respiratory illness and his symptoms subsequently began over a week ago.  He has no medical history of COPD or asthma.  He became concerned as he had shortness of breath and subsequently developed wheezing along with distress with breathing.  He is unvaccinated against COVID however is unaware of any known contacts.  On arrival patient's oxygen level was at 88% and he is tachypneic.   Past Medical History:  Diagnosis Date   Anxiety    Back pain    Chronic pain    Depression    GERD (gastroesophageal reflux disease)    Hyperlipidemia    Hypertension    Neuropathy     Patient Active Problem List   Diagnosis Date Noted   Unilateral primary osteoarthritis, right knee 05/09/2020    Past Surgical History:  Procedure Laterality Date   CARPAL TUNNEL RELEASE Left    SHOULDER SURGERY Left    WOUND EXPLORATION Right 01/11/2016   Procedure: RIGHT WRIST EXPLORATION, RIGHT WRIST NERVE, TENDON AND ARTERY  REPAIR;  Surgeon: Dairl Ponder, MD;  Location: [facility];  Service: Orthopedics;  Laterality: Right;       Home Medications    Prior to Admission medications   Medication Sig Start Date End Date Taking? Authorizing Provider  ALPRAZolam Prudy Feeler) 1 MG tablet Take 1 mg by mouth at bedtime.     [provider]  amLODipine (NORVASC) 5 MG tablet Take 5 mg by mouth daily. 11/11/19   [provider]  ascorbic acid (VITAMIN C) 500 MG tablet Take 500 mg by mouth daily.    [provider]  aspirin EC 81 MG tablet Take 81 mg by mouth at  bedtime. Swallow whole.    [provider]  atorvastatin (LIPITOR) 40 MG tablet Take 40 mg by mouth every evening. 09/23/19   [provider]  azelastine (ASTELIN) 0.1 % nasal spray Place 2 sprays into both nostrils 2 (two) times daily as needed for allergies. 09/18/19   [provider]  B Complex-C (B-COMPLEX WITH VITAMIN C) tablet Take 1 tablet by mouth daily.    [provider]  Cyanocobalamin (VITAMIN B-12) 2500 MCG SUBL Take 2,500 mcg by mouth daily.    [provider]  FLUoxetine (PROZAC) 10 MG capsule Take 10 mg by mouth 2 (two) times daily. 09/23/19   [provider]  gabapentin (NEURONTIN) 400 MG capsule Take 400 mg by mouth at bedtime.    [provider]  HYDROcodone-acetaminophen (NORCO/VICODIN) 5-325 MG per tablet Take 1 tablet by mouth every 6 (six) hours as needed for moderate pain.    [provider]  meloxicam (MOBIC) 15 MG tablet Take 15 mg by mouth daily. 11/12/19   [provider]  omeprazole (PRILOSEC) 20 MG capsule Take 40 mg by mouth daily before breakfast. 11/12/19   [provider]  Polyethyl Glycol-Propyl Glycol (SYSTANE) 0.4-0.3 % SOLN Place 1 drop into both eyes 3 (three) times daily as needed (dry/irritated eyes.).    [provider]  vitamin E 45 MG (100 UNITS) capsule Take 100 Units by mouth daily.    [provider]    Family History History reviewed. No pertinent family history.  Social History Social History   Tobacco Use   Smoking status: Never   Smokeless tobacco: Never  Substance Use Topics   Alcohol use: Yes    Comment: social   Drug use: No     Allergies   Tramadol and Septra [sulfamethoxazole-trimethoprim]   Review of Systems Review of Systems Pertinent negatives listed in HPI   Physical Exam Triage Vital Signs ED Triage Vitals [11/08/20 1631]  Enc Vitals Group     BP (!) 135/93     Pulse Rate 100     Resp (!) 22     Temp 98.4 F  (36.9 C)     Temp Source Oral     SpO2 (!) 88 %     Weight      Height      Head Circumference      Peak Flow      Pain Score      Pain Loc      Pain Edu?      Excl. in GC?    No data found.  Updated Vital Signs BP (!) 135/93 (BP Location: Right Arm)   Pulse 100   Temp 98.4 F (36.9 C) (Oral)   Resp (!) 22   SpO2 (!) 88%   Visual Acuity Right Eye Distance:   Left Eye Distance:   Bilateral Distance:    Right Eye Near:   Left Eye Near:    Bilateral Near:     Physical Exam Constitutional:      General: He is in acute distress.     Appearance: He is ill-appearing.  Cardiovascular:     Rate and Rhythm: Normal rate and regular rhythm.  Pulmonary:     Breath sounds: Wheezing and rales present.  Chest:     Chest wall: Tenderness present.  Abdominal:     General: There is distension.  Skin:    Capillary Refill: Capillary refill takes 2 to 3 seconds.  Neurological:     General: No focal deficit present.     Mental Status: He is alert.  Psychiatric:        Mood and Affect: Mood normal.        Thought Content: Thought content normal.        Judgment: Judgment normal.     UC Treatments / Results  Labs (all labs ordered are listed, but only abnormal results are displayed) Labs Reviewed  NOVEL CORONAVIRUS, NAA    EKG   Radiology No results found.  Procedures Procedures (including critical care time)  Medications Ordered in UC Medications  albuterol (VENTOLIN HFA) 108 (90 Base) MCG/ACT inhaler 2 puff (2 puffs Inhalation Given 11/08/20 1652)  AeroChamber Plus Flo-Vu Medium MISC 1 each (1 each Other Given 11/08/20 1652)    Initial Impression / Assessment and Plan / UC Course  I have reviewed the triage vital signs and the nursing notes.  Pertinent labs & imaging results that were available during my care of the patient were reviewed by me and considered in my medical decision making (see chart for details).     Discussed with patient transport to the  ER via EMS however after further discussion after use of an albuterol inhaler patient's work of breathing improved his oxygen level was maintained between 92 and got up to 97%  on 1 L of oxygen.  Upon removal of oxygen he did desat to as low as 86% with slowed and controlled breathing he improved to 88 to 89%.  Wife is transporting patient immediately to [facility], ER.  Precautions given. Final diagnoses:  Encounter for screening for COVID-19  SOB (shortness of breath)  Hypoxia     Discharge Instructions      Go immediately to [facility] Emergency Department for further work-up and evaluation of shortness of breath. Concern for possible COVID given the abrupt onset of your symptoms   ED Prescriptions   None    PDMP not reviewed this encounter.   Bing Neighbors, FNP 11/08/20 1715

## 2020-11-19 NOTE — Addendum Note (Signed)
Encounter addended by: Novella Olive on: 11/19/2020 4:32 PM  Actions taken: Letter saved

## 2021-02-21 ENCOUNTER — Encounter: Payer: Self-pay | Admitting: Orthopaedic Surgery

## 2021-02-21 ENCOUNTER — Ambulatory Visit (INDEPENDENT_AMBULATORY_CARE_PROVIDER_SITE_OTHER): Payer: 59 | Admitting: Orthopaedic Surgery

## 2021-02-21 ENCOUNTER — Ambulatory Visit: Payer: Self-pay

## 2021-02-21 DIAGNOSIS — M1711 Unilateral primary osteoarthritis, right knee: Secondary | ICD-10-CM

## 2021-02-21 NOTE — Progress Notes (Signed)
Office Visit Note   Patient: Eugene Hardin           Date of Birth: Oct 04, 1960           MRN: [id number] Visit Date: 02/21/2021              Requested by: Eugene Heys, MD [address] [place],  [place] [address] PCP: Eugene Heys, MD   Assessment & Plan: Visit Diagnoses:  1. Primary osteoarthritis of right knee     Plan: Impression is end-stage right knee DJD without relief from cortisone injections, physician directed home exercise program and activity modifications.  At this point he would like to move forward with a right total knee replacement but he will first consult with a general surgeon about his umbilical hernia to determine its urgency.  He will let us know when he is ready to undergo a knee replacement.  Risk benefits rehab recovery reviewed.  Denies nickel allergy.  Follow-Up Instructions: No follow-ups on file.   Orders:  Orders Placed This Encounter  Procedures   XR KNEE 3 VIEW RIGHT   No orders of the defined types were placed in this encounter.     Procedures: No procedures performed   Clinical Data: No additional findings.   Subjective: Chief Complaint  Patient presents with   Right Knee - Pain    Eugene Hardin is a very pleasant 60 year old gentleman here for evaluation of chronic right knee pain.  He has had pain for years and has been chronic swelling and has fallen multiple times due to the knee giving way.  He has had cortisone injections in the right knee in the past without much relief.  He takes hydrocodone for the knee pain that has been prescribed by his PCP.  He denies any mechanical symptoms.  He has been prescribed home exercises by previous providers for the knee pain.   Review of Systems  Constitutional: Negative.   All other systems reviewed and are negative.   Objective: Vital Signs: There were no vitals taken for this visit.  Physical Exam Vitals and nursing note reviewed.  Constitutional:       Appearance: He is well-developed.  Pulmonary:     Effort: Pulmonary effort is normal.  Abdominal:     Palpations: Abdomen is soft.  Skin:    General: Skin is warm.  Neurological:     Mental Status: He is alert and oriented to person, place, and time.  Psychiatric:        Behavior: Behavior normal.        Thought Content: Thought content normal.        Judgment: Judgment normal.    Ortho Exam  Right knee examination shows a mild deformity.  Tenderness of the medial side of the knee.  Collaterals and cruciates are stable.  Pain with flexion of the knee past 90 degrees.  No joint effusion.  Significant crepitus with range of motion.  Specialty Comments:  No specialty comments available.  Imaging: XR KNEE 3 VIEW RIGHT  Result Date: 02/21/2021 Advanced DJD with bone-on-bone medial compartment joint space narrowing    PMFS History: Patient Active Problem List   Diagnosis Date Noted   Unilateral primary osteoarthritis, right knee 05/09/2020   Past Medical History:  Diagnosis Date   Anxiety    Back pain    Chronic pain    Depression    GERD (gastroesophageal reflux disease)    Hyperlipidemia    Hypertension  Neuropathy     History reviewed. No pertinent family history.  Past Surgical History:  Procedure Laterality Date   CARPAL TUNNEL RELEASE Left    SHOULDER SURGERY Left    WOUND EXPLORATION Right 01/11/2016   Procedure: RIGHT WRIST EXPLORATION, RIGHT WRIST NERVE, TENDON AND ARTERY  REPAIR;  Surgeon: Dairl Ponder, MD;  Location: [facility];  Service: Orthopedics;  Laterality: Right;   Social History   Occupational History   Not on file  Tobacco Use   Smoking status: Never   Smokeless tobacco: Never  Substance and Sexual Activity   Alcohol use: Yes    Comment: social   Drug use: No   Sexual activity: Not on file

## 2021-03-22 ENCOUNTER — Other Ambulatory Visit: Payer: Self-pay | Admitting: Family Medicine

## 2021-03-23 ENCOUNTER — Other Ambulatory Visit: Payer: Self-pay | Admitting: Family Medicine

## 2021-03-23 DIAGNOSIS — N289 Disorder of kidney and ureter, unspecified: Secondary | ICD-10-CM

## 2021-03-31 ENCOUNTER — Telehealth: Payer: Self-pay | Admitting: Orthopaedic Surgery

## 2021-03-31 NOTE — Telephone Encounter (Signed)
Patient's wife Eugene Hardin) called regarding a surgery date for the right total knee arthroplasty.  Eugene Hardin has already been to to see Dr. Derrell Lolling for the hernia repair on 03-29-21.  A scheduler is supposed to reach out to him 7-10 working days.  He would like to go ahead a schedule this surgery. Patient is aware there are no surgery dates available this month, however they would like to try and get this done before the end of the year.  Please provide surgery sheet if surgery is in order.

## 2021-03-31 NOTE — Telephone Encounter (Signed)
Ok with this

## 2021-03-31 NOTE — Telephone Encounter (Signed)
Jed Limerick, I saw our mutual patient Eugene Hardin about his knee and he wants to have a replacement before the end of the year but I also see that he's scheduled for a hernia repair on 03/29/21.  Will he be recovered enough to do the knee before the end of the year?  Thanks.

## 2021-04-03 NOTE — Telephone Encounter (Signed)
It's fine by Dr. Derrell Lolling.  Thanks.

## 2021-04-10 ENCOUNTER — Other Ambulatory Visit: Payer: Self-pay

## 2021-04-11 ENCOUNTER — Ambulatory Visit
Admission: RE | Admit: 2021-04-11 | Discharge: 2021-04-11 | Disposition: A | Discharge status: Home / Self Care | Payer: 59 | Source: Ambulatory Visit | Attending: Family Medicine | Admitting: Family Medicine

## 2021-04-11 DIAGNOSIS — N289 Disorder of kidney and ureter, unspecified: Secondary | ICD-10-CM

## 2021-04-20 ENCOUNTER — Other Ambulatory Visit: Payer: Self-pay | Admitting: Family Medicine

## 2021-04-20 DIAGNOSIS — N289 Disorder of kidney and ureter, unspecified: Secondary | ICD-10-CM

## 2021-05-01 ENCOUNTER — Ambulatory Visit
Admission: RE | Admit: 2021-05-01 | Discharge: 2021-05-01 | Disposition: A | Discharge status: Home / Self Care | Payer: 59 | Source: Ambulatory Visit | Attending: Family Medicine | Admitting: Family Medicine

## 2021-05-01 ENCOUNTER — Telehealth: Payer: Self-pay

## 2021-05-01 ENCOUNTER — Other Ambulatory Visit: Payer: Self-pay

## 2021-05-01 ENCOUNTER — Other Ambulatory Visit: Payer: Self-pay | Admitting: Physician Assistant

## 2021-05-01 DIAGNOSIS — N289 Disorder of kidney and ureter, unspecified: Secondary | ICD-10-CM

## 2021-05-01 MED ORDER — METHOCARBAMOL 500 MG PO TABS: 500.0000 mg | ORAL_TABLET | Freq: Two times a day (BID) | ORAL | 2 refills | Status: DC | PRN

## 2021-05-01 MED ORDER — DOCUSATE SODIUM 100 MG PO CAPS: 100.0000 mg | ORAL_CAPSULE | Freq: Every day | ORAL | 2 refills | Status: AC | PRN

## 2021-05-01 MED ORDER — ONDANSETRON HCL 4 MG PO TABS: 4.0000 mg | ORAL_TABLET | Freq: Three times a day (TID) | ORAL | 0 refills | Status: DC | PRN

## 2021-05-01 MED ORDER — OXYCODONE-ACETAMINOPHEN 5-325 MG PO TABS: 1.0000 | ORAL_TABLET | Freq: Four times a day (QID) | ORAL | 0 refills | Status: DC | PRN

## 2021-05-01 MED ORDER — ASPIRIN EC 81 MG PO TBEC: 81.0000 mg | DELAYED_RELEASE_TABLET | Freq: Two times a day (BID) | ORAL | 0 refills | Status: DC

## 2021-05-01 MED ORDER — GADOBENATE DIMEGLUMINE 529 MG/ML IV SOLN
20.0000 mL | Freq: Once | INTRAVENOUS | Status: AC | PRN
  Administered 2021-05-01: 20 mL via INTRAVENOUS

## 2021-05-01 NOTE — Telephone Encounter (Signed)
[facility] called and they can only allow 6 percocet's a day according to the CDC.   They asked if a new Rx could be sent in stating 6 per day instead of 8 per day

## 2021-05-01 NOTE — Telephone Encounter (Signed)
Can you first call patient and see if he wants to use a diff pharmacy for percocet as he may need more than 6 per day following his surgery.  Let me know what he says

## 2021-05-01 NOTE — Telephone Encounter (Signed)
I left voicemail for patient requesting return call to let us know if he would like medication be sent to a different pharmacy.

## 2021-05-03 NOTE — Pre-Procedure Instructions (Signed)
Surgical Instructions    Your procedure is scheduled on Monday 05/08/21.   Report to [facility] Main Entrance "A" at 11:00 A.M., then check in with the Admitting office.  Call this number if you have problems the morning of surgery:  [phone]   If you have any questions prior to your surgery date call [phone]: Open Monday-Friday 8am-4pm    Remember:  Do not eat after midnight the night before your surgery  You may drink clear liquids until 10:00 A.M. the morning of your surgery.   Clear liquids allowed are: Water, Non-Citrus Juices (without pulp), Carbonated Beverages, Clear Tea, Black Coffee ONLY (NO MILK, CREAM OR POWDERED CREAMER of any kind), and Gatorade    Enhanced Recovery after Surgery for Orthopedics Enhanced Recovery after Surgery is a protocol used to improve the stress on your body and your recovery after surgery.  Patient Instructions  The day of surgery (if you do NOT have diabetes):  Drink ONE (1) Pre-Surgery Clear Ensure by 10:00 am the morning of surgery   This drink was given to you during your hospital  pre-op appointment visit. Nothing else to drink after completing the  Pre-Surgery Clear Ensure.         If you have questions, please contact your surgeons office.   Take these medicines the morning of surgery with A SIP OF WATER   amLODipine (NORVASC)  FLUoxetine (PROZAC)  omeprazole (PRILOSEC)   Take these medicines if needed:   acetaminophen (TYLENOL)   azelastine (ASTELIN)  HYDROcodone-acetaminophen (NORCO/VICODIN)  metoCLOPramide (REGLAN)  Naphazoline HCl (CLEAR EYES OP)  As of today, STOP taking any Aspirin (unless otherwise instructed by your surgeon) meloxicam (MOBIC), Aleve, Naproxen, Ibuprofen, Motrin, Advil, Goody's, BC's, all herbal medications, fish oil, and all vitamins.     After your COVID test   You are not required to quarantine however you are required to wear a well-fitting mask when you are out and around people not  in your household.  If your mask becomes wet or soiled, replace with a new one.  Wash your hands often with soap and water for 20 seconds or clean your hands with an alcohol-based hand sanitizer that contains at least 60% alcohol.  Do not share personal items.  Notify your provider: if you are in close contact with someone who has COVID  or if you develop a fever of 100.4 or greater, sneezing, cough, sore throat, shortness of breath or body aches.             Do not wear jewelry or makeup Do not wear lotions, powders, perfumes/colognes, or deodorant. Do not shave 48 hours prior to surgery.  Men may shave face and neck. Do not bring valuables to the hospital. DO Not wear nail polish, gel polish, artificial nails, or any other type of covering on natural nails including finger and toenails. If patients have artificial nails, gel coating, etc. that need to be removed by a nail salon, please have this removed prior to surgery or surgery may need to be canceled/delayed if the surgeon/ anesthesia feels like the patient is unable to be adequately monitored.             [facility] is not responsible for any belongings or valuables.  Do NOT Smoke (Tobacco/Vaping)  24 hours prior to your procedure  If you use a CPAP at night, you may bring your mask for your overnight stay.   Contacts, glasses, hearing aids, dentures or partials may not be worn  into surgery, please bring cases for these belongings   For patients admitted to the hospital, discharge time will be determined by your treatment team.   Patients discharged the day of surgery will not be allowed to drive home, and someone needs to stay with them for 24 hours.  NO VISITORS WILL BE ALLOWED IN PRE-OP WHERE PATIENTS ARE PREPPED FOR SURGERY.  ONLY 1 SUPPORT PERSON MAY BE PRESENT IN THE WAITING ROOM WHILE YOU ARE IN SURGERY.  IF YOU ARE TO BE ADMITTED, ONCE YOU ARE IN YOUR ROOM YOU WILL BE ALLOWED TWO (2) VISITORS. 1 (ONE) VISITOR MAY STAY  OVERNIGHT BUT MUST ARRIVE TO THE ROOM BY 8pm.  Minor children may have two parents present. Special consideration for safety and communication needs will be reviewed on a case by case basis.  Special instructions:    Oral Hygiene is also important to reduce your risk of infection.  Remember - BRUSH YOUR TEETH THE MORNING OF SURGERY WITH YOUR REGULAR TOOTHPASTE   [facility]- Preparing For Surgery  Before surgery, you can play an important role. Because skin is not sterile, your skin needs to be as free of germs as possible. You can reduce the number of germs on your skin by washing with CHG (chlorahexidine gluconate) Soap before surgery.  CHG is an antiseptic cleaner which kills germs and bonds with the skin to continue killing germs even after washing.     Please do not use if you have an allergy to CHG or antibacterial soaps. If your skin becomes reddened/irritated stop using the CHG.  Do not shave (including legs and underarms) for at least 48 hours prior to first CHG shower. It is OK to shave your face.  Please follow these instructions carefully.     Shower the NIGHT BEFORE SURGERY and the MORNING OF SURGERY with CHG Soap.   If you chose to wash your hair, wash your hair first as usual with your normal shampoo. After you shampoo, rinse your hair and body thoroughly to remove the shampoo.  Then [employer] and genitals (private parts) with your normal soap and rinse thoroughly to remove soap.  After that Use CHG Soap as you would any other liquid soap. You can apply CHG directly to the skin and wash gently with a scrungie or a clean washcloth.   Apply the CHG Soap to your body ONLY FROM THE NECK DOWN.  Do not use on open wounds or open sores. Avoid contact with your eyes, ears, mouth and genitals (private parts). Wash Face and genitals (private parts)  with your normal soap.   Wash thoroughly, paying special attention to the area where your surgery will be performed.  Thoroughly rinse  your body with warm water from the neck down.  DO NOT shower/wash with your normal soap after using and rinsing off the CHG Soap.  Pat yourself dry with a CLEAN TOWEL.  Wear CLEAN PAJAMAS to bed the night before surgery  Place CLEAN SHEETS on your bed the night before your surgery  DO NOT SLEEP WITH PETS.   Day of Surgery:  Take a shower with CHG soap. Wear Clean/Comfortable clothing the morning of surgery Do not apply any deodorants/lotions.   Remember to brush your teeth WITH YOUR REGULAR TOOTHPASTE.   Please read over the following fact sheets that you were given.

## 2021-05-04 ENCOUNTER — Encounter (HOSPITAL_COMMUNITY): Payer: Self-pay

## 2021-05-04 ENCOUNTER — Encounter (HOSPITAL_COMMUNITY)
Admission: RE | Admit: 2021-05-04 | Discharge: 2021-05-04 | Disposition: A | Discharge status: Home / Self Care | Payer: 59 | Source: Ambulatory Visit | Attending: Orthopaedic Surgery | Admitting: Orthopaedic Surgery

## 2021-05-04 ENCOUNTER — Other Ambulatory Visit: Payer: Self-pay

## 2021-05-04 VITALS — BP 152/95 | HR 85 | Temp 98.4°F | Resp 18 | Ht 64.0 in | Wt 218.6 lb

## 2021-05-04 DIAGNOSIS — M1711 Unilateral primary osteoarthritis, right knee: Secondary | ICD-10-CM | POA: Diagnosis not present

## 2021-05-04 DIAGNOSIS — G629 Polyneuropathy, unspecified: Secondary | ICD-10-CM | POA: Diagnosis not present

## 2021-05-04 DIAGNOSIS — I10 Essential (primary) hypertension: Secondary | ICD-10-CM | POA: Diagnosis not present

## 2021-05-04 DIAGNOSIS — K760 Fatty (change of) liver, not elsewhere classified: Secondary | ICD-10-CM | POA: Diagnosis not present

## 2021-05-04 DIAGNOSIS — Z6837 Body mass index (BMI) 37.0-37.9, adult: Secondary | ICD-10-CM | POA: Diagnosis not present

## 2021-05-04 DIAGNOSIS — K219 Gastro-esophageal reflux disease without esophagitis: Secondary | ICD-10-CM | POA: Diagnosis not present

## 2021-05-04 DIAGNOSIS — Z20822 Contact with and (suspected) exposure to covid-19: Secondary | ICD-10-CM | POA: Insufficient documentation

## 2021-05-04 DIAGNOSIS — E669 Obesity, unspecified: Secondary | ICD-10-CM | POA: Insufficient documentation

## 2021-05-04 DIAGNOSIS — M797 Fibromyalgia: Secondary | ICD-10-CM | POA: Diagnosis not present

## 2021-05-04 DIAGNOSIS — E785 Hyperlipidemia, unspecified: Secondary | ICD-10-CM | POA: Insufficient documentation

## 2021-05-04 DIAGNOSIS — Z01818 Encounter for other preprocedural examination: Secondary | ICD-10-CM | POA: Insufficient documentation

## 2021-05-04 HISTORY — DX: Pneumonia, unspecified organism: J18.9

## 2021-05-04 HISTORY — DX: Fibromyalgia: M79.7

## 2021-05-04 HISTORY — DX: Unspecified osteoarthritis, unspecified site: M19.90

## 2021-05-04 LAB — COMPREHENSIVE METABOLIC PANEL
ALT: 67 U/L — ABNORMAL HIGH (ref 0–44)
AST: 88 U/L — ABNORMAL HIGH (ref 15–41)
Albumin: 4.3 g/dL (ref 3.5–5.0)
Alkaline Phosphatase: 124 U/L (ref 38–126)
Anion gap: 9 (ref 5–15)
BUN: 11 mg/dL (ref 6–20)
CO2: 29 mmol/L (ref 22–32)
Calcium: 9 mg/dL (ref 8.9–10.3)
Chloride: 101 mmol/L (ref 98–111)
Creatinine, Ser: 0.86 mg/dL (ref 0.61–1.24)
GFR, Estimated: >60 mL/min (ref >60)
Glucose, Bld: 110 mg/dL — ABNORMAL HIGH (ref 70–99)
Potassium: 3.1 mmol/L — ABNORMAL LOW (ref 3.5–5.1)
Sodium: 139 mmol/L (ref 135–145)
Total Bilirubin: 0.6 mg/dL (ref 0.3–1.2)
Total Protein: 7.6 g/dL (ref 6.5–8.1)

## 2021-05-04 LAB — CBC WITH DIFFERENTIAL/PLATELET
Abs Immature Granulocytes: 0.01 10*3/uL (ref 0.00–0.07)
Basophils Absolute: 0.1 10*3/uL (ref 0.0–0.1)
Basophils Relative: 2 %
Eosinophils Absolute: 0.2 10*3/uL (ref 0.0–0.5)
Eosinophils Relative: 5 %
HCT: 41.4 % (ref 39.0–52.0)
Hemoglobin: 14.3 g/dL (ref 13.0–17.0)
Immature Granulocytes: 0 %
Lymphocytes Relative: 35 %
Lymphs Abs: 1.4 10*3/uL (ref 0.7–4.0)
MCH: 33.2 pg (ref 26.0–34.0)
MCHC: 34.5 g/dL (ref 30.0–36.0)
MCV: 96.1 fL (ref 80.0–100.0)
Monocytes Absolute: 0.4 10*3/uL (ref 0.1–1.0)
Monocytes Relative: 9 %
Neutro Abs: 1.9 10*3/uL (ref 1.7–7.7)
Neutrophils Relative %: 49 %
Platelets: 190 10*3/uL (ref 150–400)
RBC: 4.31 MIL/uL (ref 4.22–5.81)
RDW: 13.2 % (ref 11.5–15.5)
WBC: 3.9 10*3/uL — ABNORMAL LOW (ref 4.0–10.5)
nRBC: 0 % (ref 0.0–0.2)

## 2021-05-04 LAB — SURGICAL PCR SCREEN
MRSA, PCR: NEGATIVE
Staphylococcus aureus: NEGATIVE

## 2021-05-04 LAB — URINALYSIS, ROUTINE W REFLEX MICROSCOPIC
Bilirubin Urine: NEGATIVE
Glucose, UA: NEGATIVE mg/dL
Hgb urine dipstick: NEGATIVE
Ketones, ur: NEGATIVE mg/dL
Leukocytes,Ua: NEGATIVE
Nitrite: NEGATIVE
Protein, ur: NEGATIVE mg/dL
Specific Gravity, Urine: >1.03 — ABNORMAL HIGH (ref 1.005–1.030)
pH: 6 (ref 5.0–8.0)

## 2021-05-04 LAB — APTT: aPTT: 33 seconds (ref 24–36)

## 2021-05-04 LAB — PROTIME-INR
INR: 1.1 (ref 0.8–1.2)
Prothrombin Time: 14.6 seconds (ref 11.4–15.2)

## 2021-05-04 NOTE — Progress Notes (Addendum)
PCP - Dr. Blair Heys Cardiologist - denies  PPM/ICD - n/a  Device Orders - n/a Rep Notified - n/a  Chest x-ray - n/a EKG - 05/04/21 Stress Test - denies ECHO -  Cardiac Cath -   Sleep Study - denies CPAP - n/a  Fasting Blood Sugar - n/a Checks Blood Sugar _____ times a day- n/a  Blood Thinner Instructions: n/a Aspirin Instructions: n/a  ERAS Protcol - Yes PRE-SURGERY Ensure or G2- Ensure  COVID TEST- 05/04/21. Pending.    Anesthesia review: Yes. AST= 88. Greater than twice normal Review per anesthesia review values.   Patient denies shortness of breath, fever, cough and chest pain at PAT appointment   All instructions explained to the patient, with a verbal understanding of the material. Patient agrees to go over the instructions while at home for a better understanding. Patient also instructed to self quarantine after being tested for COVID-19. The opportunity to ask questions was provided.

## 2021-05-05 ENCOUNTER — Encounter: Payer: Self-pay | Admitting: Radiology

## 2021-05-05 ENCOUNTER — Other Ambulatory Visit: Payer: Self-pay | Admitting: Physician Assistant

## 2021-05-05 ENCOUNTER — Other Ambulatory Visit: Payer: Self-pay | Admitting: Orthopaedic Surgery

## 2021-05-05 ENCOUNTER — Telehealth: Payer: Self-pay | Admitting: Orthopaedic Surgery

## 2021-05-05 LAB — SARS CORONAVIRUS 2 (TAT 6-24 HRS): SARS Coronavirus 2: NEGATIVE

## 2021-05-05 MED ORDER — TRANEXAMIC ACID 1000 MG/10ML IV SOLN
2000.0000 mg | INTRAVENOUS | Status: DC
  Filled 2021-05-05: qty 20

## 2021-05-05 MED ORDER — POTASSIUM CHLORIDE CRYS ER 10 MEQ PO TBCR: EXTENDED_RELEASE_TABLET | ORAL | 0 refills | Status: DC

## 2021-05-05 MED ORDER — OXYCODONE-ACETAMINOPHEN 5-325 MG PO TABS: 1.0000 | ORAL_TABLET | Freq: Four times a day (QID) | ORAL | 0 refills | Status: DC | PRN

## 2021-05-05 NOTE — Progress Notes (Addendum)
Anesthesia Chart Review:  Case: [id number] Date/Time: 05/08/21 1245   Procedure: RIGHT TOTAL KNEE ARTHROPLASTY (Right: Knee)   Anesthesia type: Spinal   Pre-op diagnosis: RIGHT KNEE DEGENERATIVE JOINT DISEASE   Location: [facility] ROOM 04 / [facility] OR   Surgeons: Tarry Kos, MD       DISCUSSION: Patient is a 60 year old Hardin scheduled for the above procedure.  History includes never smoker, HTN, HLD, GERD, neuropathy, fibromyalgia, mandibular reconstruction.  Benign appearing left renal cyst and hepatic steatosis seen on 05/02/2021 MRI.  BMI is consistent with obesity. He saw Axel Filler, MD on 03/29/21 with planned  ventral hernia repair.   Preoperative lab showed K 3.1. Dub Mikes, M. L. PA-C prescribed KCL supplement. AST 88 and ALT 67 on 05/04/21. No comparison LFTs. Will request last PCP office note and labs. He does have moderate diffuse hepatic steatosis on recent MRI. History of social alcohol use is documented, no illicit drug use. On daily statin, Mobic, and acetaminophen as needed. PLT count normal at 190K 12/15/2 (144K on 11/08/20). Normal PT/PTT.   Last PCP records still pending. LFTs mildly elevated. If comparison labs show this is new, then may consider repeating LFTs, but currently no repeat preoperative labs ordered as his PLT and coags are normal and known hepatic steatosis. Anesthesia team to evaluate on the day of surgery. 05/04/21 presurgical COVID-19 test is negative. (ADDENDUM 05/05/21 2:30 PM: 02/20/21 office note by Dr. Manus Gunning received. He was referred to general surgery for umbilical hernia. Last labs there showed elevated LFTs on 09/22/20 with AST 132 and ALT 79, so will not plan to repeat prior to surgery given results are slightly lower since then.)   VS: BP (!) 152/95    Pulse 85    Temp 36.9 C (Oral)    Resp 18    Ht 5\' 4"  (1.626 m)    Wt 99.2 kg    SpO2 97%    BMI 37.52 kg/m   PROVIDERS: , MD is PCP [facility])   LABS: Preoperative labs noted. See  DISCUSSION. (all labs ordered are listed, but only abnormal results are displayed)  Labs Reviewed  CBC WITH DIFFERENTIAL/PLATELET - Abnormal; Notable for the following components:      Result Value   WBC 3.9 (*)    All other components within normal limits  COMPREHENSIVE METABOLIC PANEL - Abnormal; Notable for the following components:   Potassium 3.1 (*)    Glucose, Bld 110 (*)    AST 88 (*)    ALT 67 (*)    All other components within normal limits  URINALYSIS, ROUTINE W REFLEX MICROSCOPIC - Abnormal; Notable for the following components:   Specific Gravity, Urine >1.030 (*)    All other components within normal limits  SURGICAL PCR SCREEN  SARS CORONAVIRUS 2 (TAT 6-24 HRS)  PROTIME-INR  APTT  Comparison labs from [facility]: 09/22/20: AST 132, ALT 79.   IMAGES: MRI 05/02/21 (to evaluation renal lesion seen on CTUS): IMPRESSION: 1. Benign 1 cm Bosniak classification 2 renal cyst in the interpolar left kidney corresponding with the lesion seen on prior CT and ultrasound. No solid enhancing renal lesion. 2. Moderate diffuse hepatic steatosis.   05/04/21 Renal 04/11/21: IMPRESSION: 1. Solid 1.1 cm left renal lesion. Recommend MRI renal protocol further evaluation. 2. Possible hepatic steatosis.   1V PCXR 11/08/20: FINDINGS: Cardiac shadow is within normal limits. The lungs are well aerated bilaterally. No focal infiltrate or sizable effusion is seen. No acute bony abnormality  is seen. IMPRESSION: No acute abnormality noted.    EKG: 05/04/21: NSR   CV: N/A  Past Medical History:  Diagnosis Date   Anxiety    Arthritis    Back pain    Chronic pain    Depression    Fibromyalgia    GERD (gastroesophageal reflux disease)    Hyperlipidemia    Hypertension    Neuropathy    Pneumonia     Past Surgical History:  Procedure Laterality Date   CARPAL TUNNEL RELEASE Left    MANDIBLE RECONSTRUCTION     SHOULDER SURGERY Left    WOUND EXPLORATION Right 01/11/2016    Procedure: RIGHT WRIST EXPLORATION, RIGHT WRIST NERVE, TENDON AND ARTERY  REPAIR;  Surgeon: Dairl Ponder, MD;  Location: [facility];  Service: Orthopedics;  Laterality: Right;    MEDICATIONS:  potassium chloride (KLOR-CON M) 10 MEQ tablet   acetaminophen (TYLENOL) 500 MG tablet   ALPRAZolam (XANAX) 1 MG tablet   amLODipine (NORVASC) 5 MG tablet   ascorbic acid (VITAMIN C) 500 MG tablet   aspirin EC 81 MG tablet   atorvastatin (LIPITOR) 40 MG tablet   azelastine (ASTELIN) 0.1 % nasal spray   B Complex-C (B-COMPLEX WITH VITAMIN C) tablet   Cyanocobalamin (VITAMIN B-12) 2500 MCG SUBL   docusate sodium (COLACE) 100 MG capsule   FLUoxetine (PROZAC) 10 MG capsule   gabapentin (NEURONTIN) 400 MG capsule   HYDROcodone-acetaminophen (NORCO/VICODIN) 5-325 MG per tablet   loratadine-pseudoephedrine (CLARITIN-D 24-HOUR) 10-240 MG 24 hr tablet   meloxicam (MOBIC) 15 MG tablet   methocarbamol (ROBAXIN) 500 MG tablet   metoCLOPramide (REGLAN) 10 MG tablet   Naphazoline HCl (CLEAR EYES OP)   omeprazole (PRILOSEC) 20 MG capsule   ondansetron (ZOFRAN) 4 MG tablet   oxyCODONE-acetaminophen (PERCOCET) 5-325 MG tablet   vitamin E 180 MG (400 UNITS) capsule   No current facility-administered medications for this encounter.    Shonna Chock, PA-C Surgical Short Stay/Anesthesiology [facility] Phone [phone] [facility] Phone [phone] 05/05/2021 11:57 AM

## 2021-05-05 NOTE — Anesthesia Preprocedure Evaluation (Addendum)
Anesthesia Evaluation  Patient identified by MRN, date of birth, ID band Patient awake    Reviewed: Allergy & Precautions, NPO status , Patient's Chart, lab work & pertinent test results  Airway Mallampati: II       Dental no notable dental hx.    Pulmonary    Pulmonary exam normal        Cardiovascular hypertension, Pt. on medications Normal cardiovascular exam     Neuro/Psych PSYCHIATRIC DISORDERS Anxiety Depression    GI/Hepatic GERD  Medicated,  Endo/Other  negative endocrine ROS  Renal/GU   negative genitourinary   Musculoskeletal  (+) Arthritis , Osteoarthritis,  Fibromyalgia -  Abdominal (+) + obese,   Peds  Hematology negative hematology ROS (+)   Anesthesia Other Findings   Reproductive/Obstetrics                            Anesthesia Physical Anesthesia Plan  ASA: 2  Anesthesia Plan: MAC and Spinal   Post-op Pain Management: Regional block   Induction: Intravenous  PONV Risk Score and Plan: 1 and Propofol infusion, Ondansetron, Dexamethasone and Midazolam  Airway Management Planned: Natural Airway and Simple Face Mask  Additional Equipment: None  Intra-op Plan:   Post-operative Plan:   Informed Consent: I have reviewed the patients History and Physical, chart, labs and discussed the procedure including the risks, benefits and alternatives for the proposed anesthesia with the patient or authorized representative who has indicated his/her understanding and acceptance.     Dental advisory given  Plan Discussed with: CRNA  Anesthesia Plan Comments: (See PAT note written 05/05/2021 by Myra Gianotti, PA-C. )       Anesthesia Quick Evaluation

## 2021-05-05 NOTE — Progress Notes (Signed)
Can one of you guys call patient and let him know that his potassium is low and I have called in medicine to start asap

## 2021-05-05 NOTE — Telephone Encounter (Signed)
Patient called. Says he would like his percocet sent to [facility] on [address]. His call back number is [phone]

## 2021-05-08 ENCOUNTER — Encounter (HOSPITAL_COMMUNITY): Payer: Self-pay | Admitting: Orthopaedic Surgery

## 2021-05-08 ENCOUNTER — Observation Stay (HOSPITAL_COMMUNITY): Payer: 59

## 2021-05-08 ENCOUNTER — Ambulatory Visit (HOSPITAL_COMMUNITY): Payer: 59 | Admitting: Vascular Surgery

## 2021-05-08 ENCOUNTER — Encounter (HOSPITAL_COMMUNITY)
Admission: RE | Disposition: A | Discharge status: Home / Self Care | Payer: Self-pay | Source: Home / Self Care | Attending: Orthopaedic Surgery

## 2021-05-08 ENCOUNTER — Ambulatory Visit (HOSPITAL_COMMUNITY): Payer: 59 | Admitting: Certified Registered Nurse Anesthetist

## 2021-05-08 ENCOUNTER — Other Ambulatory Visit: Payer: Self-pay

## 2021-05-08 ENCOUNTER — Observation Stay (HOSPITAL_COMMUNITY)
Admission: RE | Admit: 2021-05-08 | Discharge: 2021-05-09 | Disposition: A | Discharge status: Home / Self Care | Payer: 59 | Attending: Orthopaedic Surgery | Admitting: Orthopaedic Surgery

## 2021-05-08 DIAGNOSIS — M1711 Unilateral primary osteoarthritis, right knee: Secondary | ICD-10-CM

## 2021-05-08 DIAGNOSIS — Z791 Long term (current) use of non-steroidal anti-inflammatories (NSAID): Secondary | ICD-10-CM | POA: Insufficient documentation

## 2021-05-08 DIAGNOSIS — Z79899 Other long term (current) drug therapy: Secondary | ICD-10-CM | POA: Insufficient documentation

## 2021-05-08 DIAGNOSIS — Z809 Family history of malignant neoplasm, unspecified: Secondary | ICD-10-CM | POA: Diagnosis not present

## 2021-05-08 DIAGNOSIS — Z96651 Presence of right artificial knee joint: Secondary | ICD-10-CM

## 2021-05-08 DIAGNOSIS — I10 Essential (primary) hypertension: Secondary | ICD-10-CM | POA: Insufficient documentation

## 2021-05-08 DIAGNOSIS — R52 Pain, unspecified: Secondary | ICD-10-CM

## 2021-05-08 HISTORY — PX: TOTAL KNEE ARTHROPLASTY: SHX125

## 2021-05-08 SURGERY — ARTHROPLASTY, KNEE, TOTAL
Anesthesia: Monitor Anesthesia Care | Site: Knee | Laterality: Right

## 2021-05-08 MED ORDER — METHOCARBAMOL 1000 MG/10ML IJ SOLN
500.0000 mg | Freq: Four times a day (QID) | INTRAVENOUS | Status: DC | PRN
  Filled 2021-05-08: qty 5

## 2021-05-08 MED ORDER — ROPIVACAINE HCL 5 MG/ML IJ SOLN
INTRAMUSCULAR | Status: DC | PRN
  Administered 2021-05-08 (×6): 5 mL via PERINEURAL

## 2021-05-08 MED ORDER — DOCUSATE SODIUM 100 MG PO CAPS
100.0000 mg | ORAL_CAPSULE | Freq: Two times a day (BID) | ORAL | Status: DC
  Administered 2021-05-08 – 2021-05-09 (×2): 100 mg via ORAL
  Filled 2021-05-08 (×2): qty 1

## 2021-05-08 MED ORDER — PROPOFOL 500 MG/50ML IV EMUL
INTRAVENOUS | Status: DC | PRN
  Administered 2021-05-08: 125 ug/kg/min via INTRAVENOUS

## 2021-05-08 MED ORDER — FLUOXETINE HCL 10 MG PO CAPS
10.0000 mg | ORAL_CAPSULE | Freq: Two times a day (BID) | ORAL | Status: DC
  Administered 2021-05-08 – 2021-05-09 (×2): 10 mg via ORAL
  Filled 2021-05-08 (×3): qty 1

## 2021-05-08 MED ORDER — POVIDONE-IODINE 10 % EX SWAB
2.0000 "application " | Freq: Once | CUTANEOUS | Status: AC
  Administered 2021-05-08: 2 via TOPICAL

## 2021-05-08 MED ORDER — PHENOL 1.4 % MT LIQD: 1.0000 | OROMUCOSAL | Status: DC | PRN

## 2021-05-08 MED ORDER — PROPOFOL 10 MG/ML IV BOLUS
INTRAVENOUS | Status: AC
  Filled 2021-05-08: qty 20

## 2021-05-08 MED ORDER — AMLODIPINE BESYLATE 5 MG PO TABS
5.0000 mg | ORAL_TABLET | Freq: Every day | ORAL | Status: DC
  Administered 2021-05-09: 10:00:00 5 mg via ORAL
  Filled 2021-05-08: qty 1

## 2021-05-08 MED ORDER — OXYCODONE HCL 5 MG PO TABS
10.0000 mg | ORAL_TABLET | ORAL | Status: DC | PRN
  Administered 2021-05-08 – 2021-05-09 (×2): 10 mg via ORAL

## 2021-05-08 MED ORDER — ACETAMINOPHEN 10 MG/ML IV SOLN
INTRAVENOUS | Status: DC | PRN
  Administered 2021-05-08: 1000 mg via INTRAVENOUS

## 2021-05-08 MED ORDER — FENTANYL CITRATE (PF) 100 MCG/2ML IJ SOLN: 100.0000 ug | Freq: Once | INTRAMUSCULAR | Status: AC

## 2021-05-08 MED ORDER — METHOCARBAMOL 500 MG PO TABS
500.0000 mg | ORAL_TABLET | Freq: Four times a day (QID) | ORAL | Status: DC | PRN
  Administered 2021-05-08 – 2021-05-09 (×3): 500 mg via ORAL
  Filled 2021-05-08 (×3): qty 1

## 2021-05-08 MED ORDER — LACTATED RINGERS IV SOLN: INTRAVENOUS | Status: DC

## 2021-05-08 MED ORDER — ONDANSETRON HCL 4 MG PO TABS: 4.0000 mg | ORAL_TABLET | Freq: Four times a day (QID) | ORAL | Status: DC | PRN

## 2021-05-08 MED ORDER — BUPIVACAINE IN DEXTROSE 0.75-8.25 % IT SOLN
INTRATHECAL | Status: DC | PRN
  Administered 2021-05-08: 1.8 mL via INTRATHECAL

## 2021-05-08 MED ORDER — CEFAZOLIN SODIUM-DEXTROSE 2-4 GM/100ML-% IV SOLN
2.0000 g | INTRAVENOUS | Status: AC
  Administered 2021-05-08: 11:00:00 2 g via INTRAVENOUS
  Filled 2021-05-08: qty 100

## 2021-05-08 MED ORDER — PHENYLEPHRINE HCL-NACL 20-0.9 MG/250ML-% IV SOLN
INTRAVENOUS | Status: DC | PRN
  Administered 2021-05-08: 20 ug/min via INTRAVENOUS

## 2021-05-08 MED ORDER — LIDOCAINE 2% (20 MG/ML) 5 ML SYRINGE
INTRAMUSCULAR | Status: DC | PRN
  Administered 2021-05-08: 40 mg via INTRAVENOUS

## 2021-05-08 MED ORDER — FENTANYL CITRATE (PF) 100 MCG/2ML IJ SOLN
INTRAMUSCULAR | Status: AC
  Administered 2021-05-08: 11:00:00 100 ug via INTRAVENOUS
  Filled 2021-05-08: qty 2

## 2021-05-08 MED ORDER — VANCOMYCIN HCL 1000 MG IV SOLR
INTRAVENOUS | Status: DC | PRN
  Administered 2021-05-08: 1000 mg via TOPICAL

## 2021-05-08 MED ORDER — PROPOFOL 10 MG/ML IV BOLUS
INTRAVENOUS | Status: DC | PRN
  Administered 2021-05-08 (×2): 20 mg via INTRAVENOUS

## 2021-05-08 MED ORDER — CEFAZOLIN SODIUM-DEXTROSE 2-4 GM/100ML-% IV SOLN
2.0000 g | Freq: Four times a day (QID) | INTRAVENOUS | Status: AC
  Administered 2021-05-08 (×2): 2 g via INTRAVENOUS
  Filled 2021-05-08 (×2): qty 100

## 2021-05-08 MED ORDER — ASPIRIN 81 MG PO CHEW
81.0000 mg | CHEWABLE_TABLET | Freq: Two times a day (BID) | ORAL | Status: DC
  Administered 2021-05-08 – 2021-05-09 (×2): 81 mg via ORAL
  Filled 2021-05-08 (×2): qty 1

## 2021-05-08 MED ORDER — IRRISEPT - 450ML BOTTLE WITH 0.05% CHG IN STERILE WATER, USP 99.95% OPTIME
TOPICAL | Status: DC | PRN
  Administered 2021-05-08: 11:00:00 450 mL via TOPICAL

## 2021-05-08 MED ORDER — ONDANSETRON HCL 4 MG/2ML IJ SOLN: 4.0000 mg | Freq: Four times a day (QID) | INTRAMUSCULAR | Status: DC | PRN

## 2021-05-08 MED ORDER — BUPIVACAINE-MELOXICAM ER 400-12 MG/14ML IJ SOLN
INTRAMUSCULAR | Status: DC | PRN
  Administered 2021-05-08: 400 mg

## 2021-05-08 MED ORDER — HYDROMORPHONE HCL 1 MG/ML IJ SOLN
0.5000 mg | INTRAMUSCULAR | Status: DC | PRN
  Administered 2021-05-08 (×2): 1 mg via INTRAVENOUS
  Filled 2021-05-08 (×2): qty 1

## 2021-05-08 MED ORDER — KETOROLAC TROMETHAMINE 15 MG/ML IJ SOLN
15.0000 mg | Freq: Four times a day (QID) | INTRAMUSCULAR | Status: DC
  Administered 2021-05-08 – 2021-05-09 (×3): 15 mg via INTRAVENOUS
  Filled 2021-05-08 (×2): qty 1

## 2021-05-08 MED ORDER — METOCLOPRAMIDE HCL 5 MG PO TABS: 5.0000 mg | ORAL_TABLET | Freq: Three times a day (TID) | ORAL | Status: DC | PRN

## 2021-05-08 MED ORDER — TRANEXAMIC ACID-NACL 1000-0.7 MG/100ML-% IV SOLN
1000.0000 mg | INTRAVENOUS | Status: AC
Start: 2021-05-08 — End: 2021-05-08
  Administered 2021-05-08: 11:00:00 1000 mg via INTRAVENOUS
  Filled 2021-05-08: qty 100

## 2021-05-08 MED ORDER — GABAPENTIN 400 MG PO CAPS
400.0000 mg | ORAL_CAPSULE | Freq: Every day | ORAL | Status: DC
  Administered 2021-05-08: 20:00:00 400 mg via ORAL
  Filled 2021-05-08: qty 1

## 2021-05-08 MED ORDER — METOCLOPRAMIDE HCL 5 MG/ML IJ SOLN: 5.0000 mg | Freq: Three times a day (TID) | INTRAMUSCULAR | Status: DC | PRN

## 2021-05-08 MED ORDER — ONDANSETRON HCL 4 MG/2ML IJ SOLN
INTRAMUSCULAR | Status: AC
  Filled 2021-05-08: qty 2

## 2021-05-08 MED ORDER — OXYCODONE HCL ER 10 MG PO T12A
10.0000 mg | EXTENDED_RELEASE_TABLET | Freq: Two times a day (BID) | ORAL | Status: DC
  Administered 2021-05-08 – 2021-05-09 (×2): 10 mg via ORAL
  Filled 2021-05-08 (×2): qty 1

## 2021-05-08 MED ORDER — SODIUM CHLORIDE 0.9 % IR SOLN
Status: DC | PRN
  Administered 2021-05-08: 1000 mL

## 2021-05-08 MED ORDER — TRANEXAMIC ACID 1000 MG/10ML IV SOLN
INTRAVENOUS | Status: DC | PRN
  Administered 2021-05-08: 11:00:00 2000 mg via TOPICAL

## 2021-05-08 MED ORDER — TRANEXAMIC ACID-NACL 1000-0.7 MG/100ML-% IV SOLN
1000.0000 mg | Freq: Once | INTRAVENOUS | Status: AC
  Administered 2021-05-08: 18:00:00 1000 mg via INTRAVENOUS

## 2021-05-08 MED ORDER — CHLORHEXIDINE GLUCONATE 0.12 % MT SOLN
15.0000 mL | Freq: Once | OROMUCOSAL | Status: AC
  Administered 2021-05-08: 09:00:00 15 mL via OROMUCOSAL
  Filled 2021-05-08: qty 15

## 2021-05-08 MED ORDER — MENTHOL 3 MG MT LOZG: 1.0000 | LOZENGE | OROMUCOSAL | Status: DC | PRN

## 2021-05-08 MED ORDER — SODIUM CHLORIDE 0.9 % IV SOLN: INTRAVENOUS | Status: DC

## 2021-05-08 MED ORDER — ACETAMINOPHEN 10 MG/ML IV SOLN
INTRAVENOUS | Status: AC
  Filled 2021-05-08: qty 100

## 2021-05-08 MED ORDER — ACETAMINOPHEN 500 MG PO TABS
1000.0000 mg | ORAL_TABLET | Freq: Four times a day (QID) | ORAL | Status: DC
  Administered 2021-05-08 – 2021-05-09 (×3): 1000 mg via ORAL
  Filled 2021-05-08 (×3): qty 2

## 2021-05-08 MED ORDER — MIDAZOLAM HCL 2 MG/2ML IJ SOLN
INTRAMUSCULAR | Status: AC
  Administered 2021-05-08: 11:00:00 2 mg via INTRAVENOUS
  Filled 2021-05-08: qty 2

## 2021-05-08 MED ORDER — MIDAZOLAM HCL 2 MG/2ML IJ SOLN: 2.0000 mg | Freq: Once | INTRAMUSCULAR | Status: AC

## 2021-05-08 MED ORDER — MIDAZOLAM HCL 5 MG/5ML IJ SOLN
INTRAMUSCULAR | Status: DC | PRN
  Administered 2021-05-08: 2 mg via INTRAVENOUS

## 2021-05-08 MED ORDER — ORAL CARE MOUTH RINSE: 15.0000 mL | Freq: Once | OROMUCOSAL | Status: AC

## 2021-05-08 MED ORDER — ACETAMINOPHEN 325 MG PO TABS: 325.0000 mg | ORAL_TABLET | Freq: Four times a day (QID) | ORAL | Status: DC | PRN

## 2021-05-08 MED ORDER — PHENYLEPHRINE 40 MCG/ML (10ML) SYRINGE FOR IV PUSH (FOR BLOOD PRESSURE SUPPORT)
PREFILLED_SYRINGE | INTRAVENOUS | Status: AC
  Filled 2021-05-08: qty 10

## 2021-05-08 MED ORDER — VANCOMYCIN HCL 1000 MG IV SOLR
INTRAVENOUS | Status: AC
  Filled 2021-05-08: qty 20

## 2021-05-08 MED ORDER — OXYCODONE HCL 5 MG PO TABS
5.0000 mg | ORAL_TABLET | ORAL | Status: DC | PRN
  Administered 2021-05-08 – 2021-05-09 (×2): 10 mg via ORAL
  Filled 2021-05-08 (×4): qty 2

## 2021-05-08 MED ORDER — MIDAZOLAM HCL 2 MG/2ML IJ SOLN
INTRAMUSCULAR | Status: AC
  Filled 2021-05-08: qty 2

## 2021-05-08 MED ORDER — DEXAMETHASONE SODIUM PHOSPHATE 10 MG/ML IJ SOLN
10.0000 mg | Freq: Once | INTRAMUSCULAR | Status: AC
  Administered 2021-05-09: 07:00:00 10 mg via INTRAVENOUS
  Filled 2021-05-08: qty 1

## 2021-05-08 MED ORDER — 0.9 % SODIUM CHLORIDE (POUR BTL) OPTIME
TOPICAL | Status: DC | PRN
  Administered 2021-05-08: 11:00:00 1000 mL

## 2021-05-08 MED ORDER — FENTANYL CITRATE (PF) 250 MCG/5ML IJ SOLN
INTRAMUSCULAR | Status: AC
  Filled 2021-05-08: qty 5

## 2021-05-08 MED ORDER — ONDANSETRON HCL 4 MG/2ML IJ SOLN
INTRAMUSCULAR | Status: DC | PRN
  Administered 2021-05-08: 4 mg via INTRAVENOUS

## 2021-05-08 MED ORDER — BUPIVACAINE-MELOXICAM ER 400-12 MG/14ML IJ SOLN
INTRAMUSCULAR | Status: AC
  Filled 2021-05-08: qty 1

## 2021-05-08 SURGICAL SUPPLY — 82 items
ADH SKN CLS APL DERMABOND .7 (GAUZE/BANDAGES/DRESSINGS) ×1
ALCOHOL 70% 16 OZ (MISCELLANEOUS) ×4 IMPLANT
BAG COUNTER SPONGE SURGICOUNT (BAG) IMPLANT
BAG DECANTER FOR FLEXI CONT (MISCELLANEOUS) ×4 IMPLANT
BAG SPNG CNTER NS LX DISP (BAG)
BAG SURGICOUNT SPONGE COUNTING (BAG)
BANDAGE ESMARK 6X9 LF (GAUZE/BANDAGES/DRESSINGS) IMPLANT
BLADE SAG 18X100X1.27 (BLADE) ×4 IMPLANT
BNDG CMPR 9X6 STRL LF SNTH (GAUZE/BANDAGES/DRESSINGS)
BNDG ESMARK 6X9 LF (GAUZE/BANDAGES/DRESSINGS)
BOWL SMART MIX CTS (DISPOSABLE) ×4 IMPLANT
BSPLAT TIB F 2 PG STRL KN RT (Stem) ×1 IMPLANT
CLOSURE STERI-STRIP 1/2X4 (GAUZE/BANDAGES/DRESSINGS) ×2
CLSR STERI-STRIP ANTIMIC 1/2X4 (GAUZE/BANDAGES/DRESSINGS) ×6 IMPLANT
COMP FEM STD CR 6 RT (Knees) ×3 IMPLANT
COMP PATELLAR 10X35 METAL (Joint) ×3 IMPLANT
COMPONENT FEM STD CR 6 RT (Knees) IMPLANT
COMPONENT PATELLAR 10X35 METAL (Joint) IMPLANT
COOLER ICEMAN CLASSIC (MISCELLANEOUS) ×4 IMPLANT
COVER SURGICAL LIGHT HANDLE (MISCELLANEOUS) ×4 IMPLANT
CUFF TOURN SGL QUICK 34 (TOURNIQUET CUFF) ×3
CUFF TOURN SGL QUICK 42 (TOURNIQUET CUFF) IMPLANT
CUFF TRNQT CYL 34X4.125X (TOURNIQUET CUFF) ×2 IMPLANT
DERMABOND ADVANCED (GAUZE/BANDAGES/DRESSINGS) ×2
DERMABOND ADVANCED .7 DNX12 (GAUZE/BANDAGES/DRESSINGS) ×2 IMPLANT
DRAPE EXTREMITY T 121X128X90 (DISPOSABLE) ×4 IMPLANT
DRAPE HALF SHEET 40X57 (DRAPES) ×4 IMPLANT
DRAPE INCISE IOBAN 66X45 STRL (DRAPES) ×4 IMPLANT
DRAPE ORTHO SPLIT 77X108 STRL (DRAPES) ×6
DRAPE POUCH INSTRU U-SHP 10X18 (DRAPES) ×4 IMPLANT
DRAPE SURG ORHT 6 SPLT 77X108 (DRAPES) ×4 IMPLANT
DRAPE U-SHAPE 47X51 STRL (DRAPES) ×8 IMPLANT
DRSG AQUACEL AG ADV 3.5X10 (GAUZE/BANDAGES/DRESSINGS) ×4 IMPLANT
DURAPREP 26ML APPLICATOR (WOUND CARE) ×12 IMPLANT
ELECT CAUTERY BLADE 6.4 (BLADE) ×4 IMPLANT
ELECT REM PT RETURN 9FT ADLT (ELECTROSURGICAL) ×3
ELECTRODE REM PT RTRN 9FT ADLT (ELECTROSURGICAL) ×2 IMPLANT
GLOVE SURG SYN 7.5  E (GLOVE) ×12
GLOVE SURG SYN 7.5 E (GLOVE) ×4 IMPLANT
GLOVE SURG SYN 7.5 PF PI (GLOVE) ×8 IMPLANT
GLOVE SURG UNDER LTX SZ7.5 (GLOVE) ×8 IMPLANT
GLOVE SURG UNDER POLY LF SZ7 (GLOVE) ×4 IMPLANT
GOWN STRL REIN XL XLG (GOWN DISPOSABLE) ×4 IMPLANT
GOWN STRL REUS W/ TWL LRG LVL3 (GOWN DISPOSABLE) ×2 IMPLANT
GOWN STRL REUS W/TWL LRG LVL3 (GOWN DISPOSABLE) ×3
HANDPIECE INTERPULSE COAX TIP (DISPOSABLE) ×3
HDLS TROCR DRIL PIN KNEE 75 (PIN) ×3
HOOD PEEL AWAY FLYTE STAYCOOL (MISCELLANEOUS) ×8 IMPLANT
INSERT TIB AS PERS 6-7X13 RT (Insert) ×2 IMPLANT
JET LAVAGE IRRISEPT WOUND (IRRIGATION / IRRIGATOR) ×3
KIT BASIN OR (CUSTOM PROCEDURE TRAY) ×4 IMPLANT
KIT TURNOVER KIT B (KITS) ×4 IMPLANT
LAVAGE JET IRRISEPT WOUND (IRRIGATION / IRRIGATOR) ×2 IMPLANT
MANIFOLD NEPTUNE II (INSTRUMENTS) ×4 IMPLANT
MARKER SKIN DUAL TIP RULER LAB (MISCELLANEOUS) ×8 IMPLANT
NDL SPNL 18GX3.5 QUINCKE PK (NEEDLE) ×2 IMPLANT
NEEDLE SPNL 18GX3.5 QUINCKE PK (NEEDLE) ×3 IMPLANT
NS IRRIG 1000ML POUR BTL (IV SOLUTION) ×4 IMPLANT
PACK TOTAL JOINT (CUSTOM PROCEDURE TRAY) ×4 IMPLANT
PAD ARMBOARD 7.5X6 YLW CONV (MISCELLANEOUS) ×8 IMPLANT
PAD COLD SHLDR WRAP-ON (PAD) ×4 IMPLANT
PIN DRILL HDLS TROCAR 75 4PK (PIN) IMPLANT
SAW OSC TIP CART 19.5X105X1.3 (SAW) ×4 IMPLANT
SCREW FEMALE HEX FIX 25X2.5 (ORTHOPEDIC DISPOSABLE SUPPLIES) ×2 IMPLANT
SET HNDPC FAN SPRY TIP SCT (DISPOSABLE) ×2 IMPLANT
STAPLER VISISTAT 35W (STAPLE) IMPLANT
STEM TIBIAL TRAB SZF RT (Stem) ×2 IMPLANT
SUCTION FRAZIER HANDLE 10FR (MISCELLANEOUS) ×3
SUCTION TUBE FRAZIER 10FR DISP (MISCELLANEOUS) ×2 IMPLANT
SUT ETHILON 2 0 FS 18 (SUTURE) IMPLANT
SUT MNCRL AB 4-0 PS2 18 (SUTURE) IMPLANT
SUT VIC AB 0 CT1 27 (SUTURE) ×6
SUT VIC AB 0 CT1 27XBRD ANBCTR (SUTURE) ×4 IMPLANT
SUT VIC AB 1 CTX 27 (SUTURE) ×12 IMPLANT
SUT VIC AB 2-0 CT1 27 (SUTURE) ×15
SUT VIC AB 2-0 CT1 TAPERPNT 27 (SUTURE) ×8 IMPLANT
SYR 50ML LL SCALE MARK (SYRINGE) ×8 IMPLANT
TOWEL GREEN STERILE (TOWEL DISPOSABLE) ×4 IMPLANT
TOWEL GREEN STERILE FF (TOWEL DISPOSABLE) ×4 IMPLANT
TRAY CATH 16FR W/PLASTIC CATH (SET/KITS/TRAYS/PACK) IMPLANT
UNDERPAD 30X36 HEAVY ABSORB (UNDERPADS AND DIAPERS) ×4 IMPLANT
YANKAUER SUCT BULB TIP NO VENT (SUCTIONS) ×8 IMPLANT

## 2021-05-08 NOTE — Anesthesia Procedure Notes (Signed)
Anesthesia Regional Block: Adductor canal block   Pre-Anesthetic Checklist: , timeout performed,  Correct Patient, Correct Site, Correct Laterality,  Correct Procedure, Correct Position, site marked,  Risks and benefits discussed,  Surgical consent,  Pre-op evaluation,  At surgeon's request and post-op pain management  Laterality: Lower and Right  Prep: chloraprep       Needles:  Injection technique: Single-shot  Needle Type: Echogenic Stimulator Needle     Needle Length: 9cm  Needle Gauge: 20   Needle insertion depth: 3 cm   Additional Needles:   Procedures:,,,, ultrasound used (permanent image in chart),,    Narrative:  Start time: 05/08/2021 10:45 AM End time: 05/08/2021 10:53 AM Injection made incrementally with aspirations every 5 mL.  Performed by: Personally  Anesthesiologist: Leilani Able, MD

## 2021-05-08 NOTE — H&P (Signed)
PREOPERATIVE H&P  Chief Complaint: RIGHT KNEE DEGENERATIVE JOINT DISEASE  HPI: Eugene Hardin is a 60 y.o. male who presents for surgical treatment of RIGHT KNEE DEGENERATIVE JOINT DISEASE.  He denies any changes in medical history.  Past Medical History:  Diagnosis Date   Anxiety    Arthritis    Back pain    Chronic pain    Depression    Fibromyalgia    GERD (gastroesophageal reflux disease)    Hyperlipidemia    Hypertension    Neuropathy    Pneumonia    Past Surgical History:  Procedure Laterality Date   CARPAL TUNNEL RELEASE Left    MANDIBLE RECONSTRUCTION     SHOULDER SURGERY Left    WOUND EXPLORATION Right 01/11/2016   Procedure: RIGHT WRIST EXPLORATION, RIGHT WRIST NERVE, TENDON AND ARTERY  REPAIR;  Surgeon: Dairl Ponder, MD;  Location: [facility];  Service: Orthopedics;  Laterality: Right;   Social History   Socioeconomic History   Marital status: Married    Spouse name: Not on file   Number of children: Not on file   Years of education: Not on file   Highest education level: Not on file  Occupational History   Not on file  Tobacco Use   Smoking status: Never   Smokeless tobacco: Never  Vaping Use   Vaping Use: Never used  Substance and Sexual Activity   Alcohol use: Yes    Comment: social   Drug use: No   Sexual activity: Not on file  Other Topics Concern   Not on file  Social History Narrative   Not on file   Social Determinants of Health   Financial Resource Strain: Not on file  Food Insecurity: Not on file  Transportation Needs: Not on file  Physical Activity: Not on file  Stress: Not on file  Social Connections: Not on file   Family History  Problem Relation Age of Onset   Brain cancer Mother    Diabetes Father    Allergies  Allergen Reactions   Tramadol Anaphylaxis   Citalopram Hydrobromide     Sleepy   Duloxetine Hcl     Sleepy   Lisinopril-Hydrochlorothiazide     irritability   Sertraline Hcl      Sleepy   Venlafaxine     GI upset   Vinegar [Acetic Acid]     hiccups   Septra [Sulfamethoxazole-Trimethoprim] Rash   Prior to Admission medications   Medication Sig Start Date End Date Taking? Authorizing Provider  acetaminophen (TYLENOL) 500 MG tablet Take 500 mg by mouth 2 (two) times daily as needed for moderate pain.   Yes [provider]  ALPRAZolam Prudy Feeler) 1 MG tablet Take 1 mg by mouth at bedtime.   Yes [provider]  amLODipine (NORVASC) 5 MG tablet Take 5 mg by mouth daily. 11/11/19  Yes [provider]  ascorbic acid (VITAMIN C) 500 MG tablet Take 500 mg by mouth daily.   Yes [provider]  aspirin EC 81 MG tablet Take 1 tablet (81 mg total) by mouth 2 (two) times daily. To be taken after surgery to prevent blood clots Patient taking differently: Take 81 mg by mouth at bedtime. To be taken after surgery to prevent blood clots 05/01/21  Yes Cristie Hem, PA-C  atorvastatin (LIPITOR) 40 MG tablet Take 40 mg by mouth every evening. 09/23/19  Yes [provider]  azelastine (ASTELIN) 0.1 % nasal spray Place 2 sprays into both  nostrils 2 (two) times daily as needed for allergies. 09/18/19  Yes [provider]  B Complex-C (B-COMPLEX WITH VITAMIN C) tablet Take 1 tablet by mouth daily.   Yes [provider]  Cyanocobalamin (VITAMIN B-12) 2500 MCG SUBL Take 2,500 mcg by mouth daily.   Yes [provider]  FLUoxetine (PROZAC) 10 MG capsule Take 10 mg by mouth 2 (two) times daily. 09/23/19  Yes [provider]  gabapentin (NEURONTIN) 400 MG capsule Take 400 mg by mouth at bedtime.   Yes [provider]  HYDROcodone-acetaminophen (NORCO/VICODIN) 5-325 MG per tablet Take 1-2 tablets by mouth every 6 (six) hours as needed for moderate pain.   Yes [provider]  loratadine-pseudoephedrine (CLARITIN-D 24-HOUR) 10-240 MG 24 hr tablet Take 1 tablet by mouth daily.   Yes [provider]   meloxicam (MOBIC) 15 MG tablet Take 15 mg by mouth daily. 11/12/19  Yes [provider]  metoCLOPramide (REGLAN) 10 MG tablet Take 10 mg by mouth 4 (four) times daily as needed (hiccups). 11/07/20  Yes [provider]  Naphazoline HCl (CLEAR EYES OP) Place 1 drop into both eyes daily as needed (irritation).   Yes [provider]  omeprazole (PRILOSEC) 20 MG capsule Take 40 mg by mouth daily before breakfast. 11/12/19  Yes [provider]  potassium chloride (KLOR-CON M) 10 MEQ tablet Take 4 pills twice daily until day of surgery 05/05/21   Cristie Hem, PA-C  vitamin E 180 MG (400 UNITS) capsule Take 400 Units by mouth daily.   Yes [provider]  docusate sodium (COLACE) 100 MG capsule Take 1 capsule (100 mg total) by mouth daily as needed. 05/01/21 05/01/22  Cristie Hem, PA-C  methocarbamol (ROBAXIN) 500 MG tablet Take 1 tablet (500 mg total) by mouth 2 (two) times daily as needed. 05/01/21   Cristie Hem, PA-C  ondansetron (ZOFRAN) 4 MG tablet Take 1 tablet (4 mg total) by mouth every 8 (eight) hours as needed for nausea or vomiting. 05/01/21   Cristie Hem, PA-C  oxyCODONE-acetaminophen (PERCOCET) 5-325 MG tablet Take 1-2 tablets by mouth every 6 (six) hours as needed. To be taken after surgery 05/05/21   Kathryne Hitch, MD     Positive ROS: All other systems have been reviewed and were otherwise negative with the exception of those mentioned in the HPI and as above.  Physical Exam: General: Alert, no acute distress Cardiovascular: No pedal edema Respiratory: No cyanosis, no use of accessory musculature GI: abdomen soft Skin: No lesions in the area of chief complaint Neurologic: Sensation intact distally Psychiatric: Patient is competent for consent with normal mood and affect Lymphatic: no lymphedema  MUSCULOSKELETAL: exam stable  Assessment: RIGHT KNEE DEGENERATIVE JOINT DISEASE  Plan: Plan for  Procedure(s): RIGHT TOTAL KNEE ARTHROPLASTY  The risks benefits and alternatives were discussed with the patient including but not limited to the risks of nonoperative treatment, versus surgical intervention including infection, bleeding, nerve injury,  blood clots, cardiopulmonary complications, morbidity, mortality, among others, and they were willing to proceed.   Preoperative templating of the joint replacement has been completed, documented, and submitted to the Operating Room personnel in order to optimize intra-operative equipment management.   Glee Arvin, MD 05/08/2021 9:05 AM

## 2021-05-08 NOTE — Anesthesia Procedure Notes (Signed)
Spinal  Patient location during procedure: OR Start time: 05/08/2021 11:17 AM End time: 05/08/2021 11:20 AM Reason for block: surgical anesthesia Staffing Performed: anesthesiologist  Anesthesiologist: Leilani Able, MD Preanesthetic Checklist Completed: patient identified, IV checked, site marked, risks and benefits discussed, surgical consent, monitors and equipment checked, pre-op evaluation and timeout performed Spinal Block Patient position: sitting Prep: DuraPrep and site prepped and draped Patient monitoring: continuous pulse ox and blood pressure Approach: midline Location: L3-4 Injection technique: single-shot Needle Needle type: Pencan  Needle gauge: 24 G Needle length: 9 cm Needle insertion depth: 6 cm Assessment Sensory level: T6 Events: CSF return

## 2021-05-08 NOTE — Transfer of Care (Signed)
Immediate Anesthesia Transfer of Care Note  Patient: Eugene Hardin [facility]  Procedure(s) Performed: RIGHT TOTAL KNEE ARTHROPLASTY (Right: Knee)  Patient Location: PACU  Anesthesia Type:Spinal and MAC combined with regional for post-op pain  Level of Consciousness: awake, alert  and oriented  Airway & Oxygen Therapy: Patient Spontanous Breathing  Post-op Assessment: Report given to RN and Post -op Vital signs reviewed and stable  Post vital signs: Reviewed and stable  Last Vitals:  Vitals Value Taken Time  BP 115/81 05/08/21 1322  Temp 36.6 C 05/08/21 1322  Pulse 72 05/08/21 1328  Resp 15 05/08/21 1328  SpO2 96 % 05/08/21 1328  Vitals shown include unvalidated device data.  Last Pain:  Vitals:   05/08/21 0920  TempSrc:   PainSc: 3       Patients Stated Pain Goal: 2 (80/88/11 0315)  Complications: No notable events documented.

## 2021-05-08 NOTE — Anesthesia Procedure Notes (Signed)
Procedure Name: MAC Date/Time: 05/08/2021 11:10 AM Performed by: Inda Coke, CRNA Pre-anesthesia Checklist: Patient identified, Emergency Drugs available, Suction available, Timeout performed and Patient being monitored Patient Re-evaluated:Patient Re-evaluated prior to induction Oxygen Delivery Method: Simple face mask Induction Type: IV induction Dental Injury: Teeth and Oropharynx as per pre-operative assessment

## 2021-05-08 NOTE — Anesthesia Postprocedure Evaluation (Signed)
Anesthesia Post Note  Patient: Eugene Hardin  Procedure(s) Performed: RIGHT TOTAL KNEE ARTHROPLASTY (Right: Knee)     Patient location during evaluation: PACU Anesthesia Type: MAC and Spinal Level of consciousness: awake Pain management: pain level controlled Vital Signs Assessment: post-procedure vital signs reviewed and stable Respiratory status: spontaneous breathing Cardiovascular status: stable Postop Assessment: no headache, no backache, spinal receding, patient able to bend at knees and no apparent nausea or vomiting Anesthetic complications: no   No notable events documented.  Last Vitals:  Vitals:   05/08/21 1437 05/08/21 1454  BP: 117/73 116/81  Pulse: 71 73  Resp: 14 (!) 21  Temp:    SpO2: 94% 96%    Last Pain:  Vitals:   05/08/21 1428  TempSrc:   PainSc: 0-No pain                 Huston Foley

## 2021-05-08 NOTE — Op Note (Signed)
Total Knee Arthroplasty Procedure Note  Preoperative diagnosis: Right knee osteoarthritis, varus deformity  Postoperative diagnosis:same  Operative procedure: Right total knee arthroplasty. CPT [phone]  Surgeon: N. Glee Arvin, MD  Assist: Oneal Grout, PA-C; necessary for the timely completion of procedure and due to complexity of procedure.  Anesthesia: Spinal, regional  Tourniquet time: 45 minutes  Implants used: Zimmer persona pressfit Femur: CR 6 Tibia: F Patella: 35 mm Polyethylene: 13 mm, MC  Indication: Eugene Hardin is a 60 y.o. year old male with a history of knee pain. Having failed conservative management, the patient elected to proceed with a total knee arthroplasty.  We have reviewed the risk and benefits of the surgery and they elected to proceed after voicing understanding.  Procedure:  After informed consent was obtained and understanding of the risk were voiced including but not limited to bleeding, infection, damage to surrounding structures including nerves and vessels, blood clots, leg length inequality and the failure to achieve desired results, the operative extremity was marked with verbal confirmation of the patient in the holding area.   The patient was then brought to the operating room and transported to the operating room table in the supine position.  A tourniquet was applied to the operative extremity around the upper thigh. The operative limb was then prepped and draped in the usual sterile fashion and preoperative antibiotics were administered.  A time out was performed prior to the start of surgery confirming the correct extremity, preoperative antibiotic administration, as well as team members, implants and instruments available for the case. Correct surgical site was also confirmed with preoperative radiographs. The limb was then elevated for exsanguination and the tourniquet was inflated. A midline incision was made and a standard  medial parapatellar approach was performed.  The patella was prepared and sized to a 35 mm.  A cover was placed on the patella for protection from retractors.  We then turned our attention to the femur. Posterior cruciate ligament was sacrificed. Start site was drilled in the femur and the intramedullary distal femoral cutting guide was placed, set at 5 degrees valgus, taking 10 mm of distal resection. The distal cut was made. Osteophytes were then removed.  Next, the proximal tibial cutting guide was placed with appropriate slope, varus/valgus alignment and depth of resection. The proximal tibial cut was made taking 2 mm off the lower medial side. Gap blocks were then used to assess the extension gap and alignment, and appropriate soft tissue releases were performed. Attention was turned back to the femur, which was sized using the sizing guide to a size 6. Appropriate rotation of the femoral component was determined using epicondylar axis, Whitesides line, and assessing the flexion gap under ligament tension. The appropriate size 4-in-1 cutting block was placed and cuts were made.  Posterior femoral osteophytes and uncapped bone were then removed with the curved osteotome.  Trial components were placed, and stability was checked in full extension, mid-flexion, and deep flexion. Proper tibial rotation was determined and marked.  The patella tracked well without a lateral release. Trial components were then removed and tibial preparation performed.  The tibia was sized for a size F component.  The bony surfaces were irrigated with a pulse lavage and then dried. The stability of the construct was re-evaluated throughout a range of motion and found to be acceptable. The trial liner was removed, the knee was copiously irrigated, and the knee was re-evaluated for any excess bone debris. The real polyethylene liner, 13  mm thick, was inserted and checked to ensure the locking mechanism had engaged appropriately. The  tourniquet was deflated and hemostasis was achieved. The wound was irrigated with normal saline.  One gram of vancomycin powder was placed in the surgical bed.  Topical 0.25% bupivacaine and meloxicam was placed in the joint for postoperative pain.  Capsular closure was performed with a #1 vicryl, subcutaneous fat closed with a 0 vicryl suture, then subcutaneous tissue closed with interrupted 2.0 vicryl suture. The skin was then closed with a 2.0 nylon and dermabond. A sterile dressing was applied.  The patient was awakened in the operating room and taken to recovery in stable condition. All sponge, needle, and instrument counts were correct at the end of the case.  Tessa Lerner was necessary for opening, closing, retracting, limb positioning and overall facilitation and completion of the surgery.  Position: supine  Complications: none.  Time Out: performed   Drains/Packing: none  Estimated blood loss: minimal  Returned to Recovery Room: in good condition.   Antibiotics: yes   Mechanical VTE (DVT) Prophylaxis: sequential compression devices, TED thigh-high  Chemical VTE (DVT) Prophylaxis: aspirin  Fluid Replacement  Crystalloid: see anesthesia record Blood: none  FFP: none   Specimens Removed: 1 to pathology   Sponge and Instrument Count Correct? yes   PACU: portable radiograph - knee AP and Lateral   Plan/RTC: Return in 2 weeks for wound check.   Weight Bearing/Load Lower Extremity: full   Implant Name Type Inv. Item Serial No. Manufacturer Lot No. LRB No. Used Action  STEM TIBIAL TRAB SZF RT - [id number] Stem STEM TIBIAL TRAB SZF RT  [employer](ORTH,TRAU,BIO,SG) [id number] Right 1 Implanted  COMP PATELLAR 10X35 METAL - [id number] Joint COMP PATELLAR 10X35 METAL  [employer](ORTH,TRAU,BIO,SG) [id number] Right 1 Implanted  COMP FEM STD CR 6 RT - [id number] Knees COMP FEM STD CR 6 RT  [employer]MER [employer]ON(ORTH,TRAU,BIO,SG) [id number] Right 1 Implanted  INSERT TIB AS PERS 6-7X13 RT -  [id number] Insert INSERT TIB AS PERS 6-7X13 RT  [employer]MER [employer]ON(ORTH,TRAU,BIO,SG) [id number] Right 1 Implanted    N. Glee Arvin, MD [facility] 12:44 PM

## 2021-05-08 NOTE — Discharge Instructions (Signed)

## 2021-05-08 NOTE — Evaluation (Signed)
Physical Therapy Evaluation Patient Details Name: Eugene Hardin MRN: [id number] DOB: 01/30/1961 Today's Date: 05/08/2021  History of Present Illness  Pt is a 60 y/o male s/p R TKA on 12/19. PMH includes HTN and fibromyalgia.  Clinical Impression  Pt is s/p surgery above with deficits below. Pt tolerated ambulation very well this session. Min guard for safety using RW. Reviewed knee precautions and HEP with pt. Reports wife will be able to assist as needed at d/c. Will continue to follow acutely.        Recommendations for follow up therapy are one component of a multi-disciplinary discharge planning process, led by the attending physician.  Recommendations may be updated based on patient status, additional functional criteria and insurance authorization.  Follow Up Recommendations Follow physician's recommendations for discharge plan and follow up therapies    Assistance Recommended at Discharge Intermittent Supervision/Assistance  Functional Status Assessment Patient has had a recent decline in their functional status and demonstrates the ability to make significant improvements in function in a reasonable and predictable amount of time.  Equipment Recommendations  None recommended by PT    Recommendations for Other Services       Precautions / Restrictions Precautions Precautions: Knee Precaution Booklet Issued: No Precaution Comments: Verbally reviewed knee precautions Restrictions Weight Bearing Restrictions: Yes RLE Weight Bearing: Weight bearing as tolerated      Mobility  Bed Mobility Overal bed mobility: Needs Assistance Bed Mobility: Supine to Sit     Supine to sit: Min assist     General bed mobility comments: Min A for RLE assist    Transfers Overall transfer level: Needs assistance Equipment used: Rolling walker (2 wheels) Transfers: Sit to/from Stand Sit to Stand: Min guard           General transfer comment: Min guard for safety     Ambulation/Gait Ambulation/Gait assistance: Min guard Gait Distance (Feet): 100 Feet Assistive device: Rolling walker (2 wheels) Gait Pattern/deviations: Step-through pattern;Decreased stride length Gait velocity: Decreased     General Gait Details: Min guard for safety. Cues for sequencing using RW  Stairs            Wheelchair Mobility    Modified Rankin (Stroke Patients Only)       Balance Overall balance assessment: Needs assistance Sitting-balance support: No upper extremity supported;Feet supported Sitting balance-Leahy Scale: Good     Standing balance support: Bilateral upper extremity supported;During functional activity Standing balance-Leahy Scale: Poor Standing balance comment: Reliant on BUE support                             Pertinent Vitals/Pain Pain Assessment: Faces Faces Pain Scale: Hurts little more Pain Location: R knee Pain Descriptors / Indicators: Grimacing;Operative site guarding Pain Intervention(s): Limited activity within patient's tolerance;Monitored during session;Repositioned    Home Living Family/patient expects to be discharged to:: Private residence Living Arrangements: Spouse/significant other Available Help at Discharge: Family Type of Home: House Home Access: Stairs to enter Entrance Stairs-Rails: Doctor, general practice of Steps: 5-6   Home Layout: One level Home Equipment: Agricultural consultant (2 wheels)      Prior Function Prior Level of Function : Independent/Modified Independent                     Hand Dominance        Extremity/Trunk Assessment   Upper Extremity Assessment Upper Extremity Assessment: Defer to OT evaluation    Lower Extremity  Assessment Lower Extremity Assessment: RLE deficits/detail RLE Deficits / Details: Deficits consistent with post op pain and weakness.    Cervical / Trunk Assessment Cervical / Trunk Assessment: Normal  Communication   Communication:  No difficulties  Cognition Arousal/Alertness: Awake/alert Behavior During Therapy: WFL for tasks assessed/performed Overall Cognitive Status: Within Functional Limits for tasks assessed                                          General Comments General comments (skin integrity, edema, etc.): Pt's wife present throughout    Exercises Total Joint Exercises Ankle Circles/Pumps: AROM;Both;10 reps Quad Sets: AROM;Both;5 reps   Assessment/Plan    PT Assessment Patient needs continued PT services  PT Problem List Decreased strength;Decreased range of motion;Decreased balance;Decreased mobility;Pain;Decreased knowledge of use of DME       PT Treatment Interventions Gait training;DME instruction;Functional mobility training;Stair training;Therapeutic exercise;Therapeutic activities;Balance training;Patient/family education    PT Goals (Current goals can be found in the Care Plan section)  Acute Rehab PT Goals Patient Stated Goal: to go home PT Goal Formulation: With patient Time For Goal Achievement: 05/22/21 Potential to Achieve Goals: Good    Frequency 7X/week   Barriers to discharge        Co-evaluation               AM-PAC PT "6 Clicks" Mobility  Outcome Measure Help needed turning from your back to your side while in a flat bed without using bedrails?: None Help needed moving from lying on your back to sitting on the side of a flat bed without using bedrails?: A Little Help needed moving to and from a bed to a chair (including a wheelchair)?: A Little Help needed standing up from a chair using your arms (e.g., wheelchair or bedside chair)?: A Little Help needed to walk in hospital room?: A Little Help needed climbing 3-5 steps with a railing? : A Little 6 Click Score: 19    End of Session Equipment Utilized During Treatment: Gait belt Activity Tolerance: Patient tolerated treatment well Patient left: in chair;with call bell/phone within reach;with  family/visitor present Nurse Communication: Mobility status PT Visit Diagnosis: Other abnormalities of gait and mobility (R26.89);Pain Pain - Right/Left: Right Pain - part of body: Knee    Time: 6270-3500 PT Time Calculation (min) (ACUTE ONLY): 20 min   Charges:   PT Evaluation $PT Eval Low Complexity: 1 Low          Cindee Salt, DPT  Acute Rehabilitation Services  Pager: [phone] Office: [phone]   Lehman Prom 05/08/2021, 4:37 PM

## 2021-05-09 ENCOUNTER — Telehealth: Payer: Self-pay

## 2021-05-09 ENCOUNTER — Encounter (HOSPITAL_COMMUNITY): Payer: Self-pay | Admitting: Orthopaedic Surgery

## 2021-05-09 DIAGNOSIS — M1711 Unilateral primary osteoarthritis, right knee: Secondary | ICD-10-CM | POA: Diagnosis not present

## 2021-05-09 LAB — CBC
HCT: 35 % — ABNORMAL LOW (ref 39.0–52.0)
Hemoglobin: 12.1 g/dL — ABNORMAL LOW (ref 13.0–17.0)
MCH: 34.5 pg — ABNORMAL HIGH (ref 26.0–34.0)
MCHC: 34.6 g/dL (ref 30.0–36.0)
MCV: 99.7 fL (ref 80.0–100.0)
Platelets: 169 10*3/uL (ref 150–400)
RBC: 3.51 MIL/uL — ABNORMAL LOW (ref 4.22–5.81)
RDW: 13.5 % (ref 11.5–15.5)
WBC: 4.8 10*3/uL (ref 4.0–10.5)
nRBC: 0 % (ref 0.0–0.2)

## 2021-05-09 LAB — BASIC METABOLIC PANEL
Anion gap: 9 (ref 5–15)
BUN: 9 mg/dL (ref 6–20)
CO2: 26 mmol/L (ref 22–32)
Calcium: 8.7 mg/dL — ABNORMAL LOW (ref 8.9–10.3)
Chloride: 101 mmol/L (ref 98–111)
Creatinine, Ser: 1.06 mg/dL (ref 0.61–1.24)
GFR, Estimated: >60 mL/min (ref >60)
Glucose, Bld: 119 mg/dL — ABNORMAL HIGH (ref 70–99)
Potassium: 3.8 mmol/L (ref 3.5–5.1)
Sodium: 136 mmol/L (ref 135–145)

## 2021-05-09 MED ORDER — ALBUTEROL SULFATE HFA 108 (90 BASE) MCG/ACT IN AERS
INHALATION_SPRAY | RESPIRATORY_TRACT | Status: AC
  Filled 2021-05-09: qty 6.7

## 2021-05-09 NOTE — Progress Notes (Signed)
Subjective: 1 Day Post-Op Procedure(s) (LRB): RIGHT TOTAL KNEE ARTHROPLASTY (Right) Patient reports pain as mild.    Objective: Vital signs in last 24 hours: Temp:  [97.6 F (36.4 C)-98.2 F (36.8 C)] 97.6 F (36.4 C) (12/20 0744) Pulse Rate:  [65-97] 76 (12/20 0744) Resp:  [7-24] 16 (12/20 0744) BP: (110-158)/(73-94) 129/88 (12/20 0744) SpO2:  [94 %-100 %] 94 % (12/20 0744) Weight:  [99.2 kg] 99.2 kg (12/19 0901)  Intake/Output from previous day: 12/19 0701 - 12/20 0700 In: 2040 [P.O.:540; I.V.:1100; IV Piggyback:400] Out: 350 [Urine:325; Blood:25] Intake/Output this shift: No intake/output data recorded.  Recent Labs    05/09/21 0557  HGB 12.1*   Recent Labs    05/09/21 0557  WBC 4.8  RBC 3.51*  HCT 35.0*  PLT 169   Recent Labs    05/09/21 0557  NA 136  K 3.8  CL 101  CO2 26  BUN 9  CREATININE 1.06  GLUCOSE 119*  CALCIUM 8.7*   No results for input(s): LABPT, INR in the last 72 hours.  Neurologically intact Neurovascular intact Sensation intact distally Intact pulses distally Dorsiflexion/Plantar flexion intact Incision: dressing C/D/I No cellulitis present Compartment soft   Assessment/Plan: 1 Day Post-Op Procedure(s) (LRB): RIGHT TOTAL KNEE ARTHROPLASTY (Right) Advance diet Up with therapy D/C IV fluids Discharge home with home health after first or second PT session depending on progression  WBAT RLE ABLA- mild and stable   Anticipated LOS equal to or greater than 2 midnights due to - Age 8 and older with one or more of the following:  - Obesity  - Expected need for hospital services (PT, OT, Nursing) required for safe  discharge  - Anticipated need for postoperative skilled nursing care or inpatient rehab  - Active co-morbidities: None OR   - Unanticipated findings during/Post Surgery: None  - Patient is a high risk of re-admission due to: None   Cristie Hem 05/09/2021, 8:24 AM

## 2021-05-09 NOTE — Evaluation (Addendum)
Occupational Therapy Evaluation Patient Details Name: Eugene Hardin MRN: [id number] DOB: 09-29-1960 Today's Date: 05/09/2021   History of Present Illness 60 y/o male s/p R TKA on 12/19. PMH includes HTN and fibromyalgia.   Clinical Impression   PTA, pt was living with his wife and was independent. Currently, pt performing Supervision-Mod I for ADLs and functional mobility using RW. Provided education and handout on LB ADLs, toilet transfer, and tub transfer with shower seat; pt demonstrated understanding. Answered all pt questions. Recommend dc home once medically stable per physician. All acute OT needs met and will sign off. Thank you.      Recommendations for follow up therapy are one component of a multi-disciplinary discharge planning process, led by the attending physician.  Recommendations may be updated based on patient status, additional functional criteria and insurance authorization.   Follow Up Recommendations  No OT follow up    Assistance Recommended at Discharge None  Functional Status Assessment  Patient has had a recent decline in their functional status and demonstrates the ability to make significant improvements in function in a reasonable and predictable amount of time.  Equipment Recommendations  None recommended by OT    Recommendations for Other Services       Precautions / Restrictions Precautions Precautions: Knee Precaution Booklet Issued: No Precaution Comments: Verbally reviewed knee precautions Restrictions Weight Bearing Restrictions: Yes RLE Weight Bearing: Weight bearing as tolerated      Mobility Bed Mobility Overal bed mobility: Modified Independent             General bed mobility comments: Mod A with increased time    Transfers Overall transfer level: Needs assistance Equipment used: None Transfers: Sit to/from Stand Sit to Stand: Supervision           General transfer comment: Supervision for safety      Balance  Overall balance assessment: Needs assistance Sitting-balance support: No upper extremity supported;Feet supported Sitting balance-Leahy Scale: Good     Standing balance support: Bilateral upper extremity supported;During functional activity;No upper extremity supported Standing balance-Leahy Scale: Fair                             ADL either performed or assessed with clinical judgement   ADL Overall ADL's : Modified independent                                       General ADL Comments: Increased time. Providing education on LB dressing, toileting, and tub transfer with shower seat. Provided handout for tub transfer     Vision         Perception     Praxis      Pertinent Vitals/Pain Pain Assessment: Faces Faces Pain Scale: Hurts little more Pain Location: R knee Pain Descriptors / Indicators: Grimacing;Operative site guarding Pain Intervention(s): Monitored during session;Limited activity within patient's tolerance;Repositioned     Hand Dominance     Extremity/Trunk Assessment Upper Extremity Assessment Upper Extremity Assessment: Overall WFL for tasks assessed   Lower Extremity Assessment Lower Extremity Assessment: Defer to PT evaluation RLE Deficits / Details: Deficits consistent with post op pain and weakness.   Cervical / Trunk Assessment Cervical / Trunk Assessment: Normal   Communication Communication Communication: No difficulties   Cognition Arousal/Alertness: Awake/alert Behavior During Therapy: WFL for tasks assessed/performed Overall Cognitive Status: Within Functional Limits for tasks  assessed                                       General Comments       Exercises     Shoulder Instructions      Home Living Family/patient expects to be discharged to:: Private residence Living Arrangements: Spouse/significant other Available Help at Discharge: Family Type of Home: House Home Access: Stairs to  enter [employer] of Steps: 5-6 Entrance Stairs-Rails: Right;Left Home Layout: One level     Bathroom Shower/Tub: Teacher, early years/pre: [place]: Conservation officer, nature (2 wheels)          Prior Functioning/Environment Prior Level of Function : Independent/Modified Independent                        OT Problem List: Decreased activity tolerance;Impaired balance (sitting and/or standing);Decreased range of motion;Decreased knowledge of use of DME or AE;Decreased knowledge of precautions      OT Treatment/Interventions:      OT Goals(Current goals can be found in the care plan section) Acute Rehab OT Goals Patient Stated Goal: Go home OT Goal Formulation: All assessment and education complete, DC therapy  OT Frequency:     Barriers to D/C:            Co-evaluation              AM-PAC OT "6 Clicks" Daily Activity     Outcome Measure Help from another person eating meals?: None Help from another person taking care of personal grooming?: None Help from another person toileting, which includes using toliet, bedpan, or urinal?: None Help from another person bathing (including washing, rinsing, drying)?: None Help from another person to put on and taking off regular upper body clothing?: None Help from another person to put on and taking off regular lower body clothing?: None 6 Click Score: 24   End of Session Nurse Communication: Mobility status  Activity Tolerance: Patient tolerated treatment well Patient left: in chair;with call bell/phone within reach  OT Visit Diagnosis: Unsteadiness on feet (R26.81);Other abnormalities of gait and mobility (R26.89);Muscle weakness (generalized) (M62.81)                Time: 6301-6010 OT Time Calculation (min): 17 min Charges:  OT General Charges $OT Visit: 1 Visit OT Evaluation $OT Eval Low Complexity: Eugene Hardin, OTR/L Acute Rehab Pager:  [phone] Office: [place] 05/09/2021, 8:43 AM

## 2021-05-09 NOTE — Telephone Encounter (Signed)
Patient called stating that he believes that his incision may be bleeding due to having a spot on the bandage that is getting bigger.  Stated that he removed ice pack/machine.  Had right TKA on 05/08/2021.  Would like to know what he needs to do?  Cb# [phone].  Please advise.  Thank you.

## 2021-05-09 NOTE — Plan of Care (Signed)
Pt doing well. Pt given D/C instructions with verbal understanding. Rx's were sent to the pharmacy by MD. Pt's incision is clean and dry with no sign of infection. Pt's IV was removed prior to D/C. Pt D/C'd home via wheelchair per MD order. Pt is stable @ D/C and has no other needs at this time. Tavon Corriher, RN  

## 2021-05-09 NOTE — Discharge Summary (Signed)
Patient ID: Eugene Hardin MRN: [id number] DOB/AGE: 11-14-1960 60 y.o.  Admit date: 05/08/2021 Discharge date: 05/09/2021  Admission Diagnoses:  Principal Problem:   Primary osteoarthritis of right knee Active Problems:   Status post total knee replacement, right   Discharge Diagnoses:  Same  Past Medical History:  Diagnosis Date   Anxiety    Arthritis    Back pain    Chronic pain    Depression    Fibromyalgia    GERD (gastroesophageal reflux disease)    Hyperlipidemia    Hypertension    Neuropathy    Pneumonia     Surgeries: Procedure(s): RIGHT TOTAL KNEE ARTHROPLASTY on 05/08/2021   Consultants:   Discharged Condition: Improved  Hospital Course: FARMER MCCAHILL is an 60 y.o. male who was admitted 05/08/2021 for operative treatment ofPrimary osteoarthritis of right knee. Patient has severe unremitting pain that affects sleep, daily activities, and work/hobbies. After pre-op clearance the patient was taken to the operating room on 05/08/2021 and underwent  Procedure(s): RIGHT TOTAL KNEE ARTHROPLASTY.    Patient was given perioperative antibiotics:  Anti-infectives (From admission, onward)    Start     Dose/Rate Route Frequency Ordered Stop   05/08/21 1615  ceFAZolin (ANCEF) IVPB 2g/100 mL premix        2 g 200 mL/hr over 30 Minutes Intravenous Every 6 hours 05/08/21 1518 05/08/21 2240   05/08/21 1039  vancomycin (VANCOCIN) powder  Status:  Discontinued          As needed 05/08/21 1039 05/08/21 1318   05/08/21 0915  ceFAZolin (ANCEF) IVPB 2g/100 mL premix        2 g 200 mL/hr over 30 Minutes Intravenous On call to O.R. 05/08/21 0904 05/08/21 1157        Patient was given sequential compression devices, early ambulation, and chemoprophylaxis to prevent DVT.  Patient benefited maximally from hospital stay and there were no complications.    Recent vital signs: Patient Vitals for the past 24 hrs:  BP Temp Temp src Pulse Resp SpO2 Height Weight   05/09/21 0744 129/88 97.6 F (36.4 C) -- 76 16 94 % -- --  05/09/21 0354 128/74 98 F (36.7 C) Oral 86 20 97 % -- --  05/08/21 2327 122/80 98.2 F (36.8 C) Oral 82 20 99 % -- --  05/08/21 1952 (!) 123/93 97.8 F (36.6 C) Oral 76 18 96 % -- --  05/08/21 1516 122/81 97.8 F (36.6 C) Oral 70 18 97 % -- --  05/08/21 1454 116/81 -- -- 73 (!) 21 96 % -- --  05/08/21 1437 117/73 -- -- 71 14 94 % -- --  05/08/21 1428 117/82 -- -- 76 14 97 % -- --  05/08/21 1407 114/75 97.8 F (36.6 C) -- 76 (!) 24 98 % -- --  05/08/21 1352 120/85 -- -- 65 15 94 % -- --  05/08/21 1337 110/78 -- -- 70 12 95 % -- --  05/08/21 1322 115/81 97.8 F (36.6 C) -- 76 14 95 % -- --  05/08/21 1053 140/86 -- -- 81 13 99 % -- --  05/08/21 1048 135/83 -- -- 73 (!) 7 98 % -- --  05/08/21 1043 (!) 154/93 -- -- 91 10 100 % -- --  05/08/21 0901 (!) 158/94 98.2 F (36.8 C) Oral 97 18 99 % 5\' 4"  (1.626 m) 99.2 kg     Recent laboratory studies:  Recent Labs    05/09/21 0557  WBC 4.8  HGB  12.1*  HCT 35.0*  PLT 169  NA 136  K 3.8  CL 101  CO2 26  BUN 9  CREATININE 1.06  GLUCOSE 119*  CALCIUM 8.7*     Discharge Medications:   Allergies as of 05/09/2021       Reactions   Tramadol Anaphylaxis   Citalopram Hydrobromide    Sleepy   Duloxetine Hcl    Sleepy   Lisinopril-hydrochlorothiazide    irritability   Sertraline Hcl    Sleepy   Venlafaxine    GI upset   Vinegar [acetic Acid]    hiccups   Septra [sulfamethoxazole-trimethoprim] Rash        Medication List     STOP taking these medications    acetaminophen 500 MG tablet Commonly known as: TYLENOL   HYDROcodone-acetaminophen 5-325 MG tablet Commonly known as: NORCO/VICODIN   meloxicam 15 MG tablet Commonly known as: MOBIC       TAKE these medications    ALPRAZolam 1 MG tablet Commonly known as: XANAX Take 1 mg by mouth at bedtime.   amLODipine 5 MG tablet Commonly known as: NORVASC Take 5 mg by mouth daily.   ascorbic acid  500 MG tablet Commonly known as: VITAMIN C Take 500 mg by mouth daily.   aspirin EC 81 MG tablet Take 1 tablet (81 mg total) by mouth 2 (two) times daily. To be taken after surgery to prevent blood clots What changed: when to take this   atorvastatin 40 MG tablet Commonly known as: LIPITOR Take 40 mg by mouth every evening.   azelastine 0.1 % nasal spray Commonly known as: ASTELIN Place 2 sprays into both nostrils 2 (two) times daily as needed for allergies.   B-complex with vitamin C tablet Take 1 tablet by mouth daily.   CLEAR EYES OP Place 1 drop into both eyes daily as needed (irritation).   docusate sodium 100 MG capsule Commonly known as: Colace Take 1 capsule (100 mg total) by mouth daily as needed.   FLUoxetine 10 MG capsule Commonly known as: PROZAC Take 10 mg by mouth 2 (two) times daily.   gabapentin 400 MG capsule Commonly known as: NEURONTIN Take 400 mg by mouth at bedtime.   loratadine-pseudoephedrine 10-240 MG 24 hr tablet Commonly known as: CLARITIN-D 24-hour Take 1 tablet by mouth daily.   methocarbamol 500 MG tablet Commonly known as: Robaxin Take 1 tablet (500 mg total) by mouth 2 (two) times daily as needed.   metoCLOPramide 10 MG tablet Commonly known as: REGLAN Take 10 mg by mouth 4 (four) times daily as needed (hiccups).   omeprazole 20 MG capsule Commonly known as: PRILOSEC Take 40 mg by mouth daily before breakfast.   ondansetron 4 MG tablet Commonly known as: Zofran Take 1 tablet (4 mg total) by mouth every 8 (eight) hours as needed for nausea or vomiting.   oxyCODONE-acetaminophen 5-325 MG tablet Commonly known as: Percocet Take 1-2 tablets by mouth every 6 (six) hours as needed. To be taken after surgery   potassium chloride 10 MEQ tablet Commonly known as: KLOR-CON M Take 4 pills twice daily until day of surgery   Vitamin B-12 2500 MCG Subl Take 2,500 mcg by mouth daily.   vitamin E 180 MG (400 UNITS) capsule Take 400  Units by mouth daily.               Durable Medical Equipment  (From admission, onward)           Start  Ordered   05/08/21 1519  DME Walker rolling  Once       Question Answer Comment  Walker: With 5 Inch Wheels   Patient needs a walker to treat with the following condition Status post left partial knee replacement      05/08/21 1518   05/08/21 1519  DME 3 n 1  Once        05/08/21 1518   05/08/21 1519  DME Bedside commode  Once       Question:  Patient needs a bedside commode to treat with the following condition  Answer:  Status post left partial knee replacement   05/08/21 1518            Diagnostic Studies: MR ABDOMEN WWO CONTRAST  Result Date: 05/02/2021 CLINICAL DATA:  Further evaluation of renal lesion seen on prior CT and ultrasound EXAM: MRI ABDOMEN WITHOUT AND WITH CONTRAST TECHNIQUE: Multiplanar multisequence MR imaging of the abdomen was performed both before and after the administration of intravenous contrast. CONTRAST:  20mL MULTIHANCE GADOBENATE DIMEGLUMINE 529 MG/ML IV SOLN COMPARISON:  CT Oct 13, 2020 and ultrasound April 11, 2021 FINDINGS: Lower chest: No acute abnormality. Hepatobiliary: Moderate diffuse hepatic steatosis. No suspicious hepatic lesion. Gallbladder is unremarkable. No biliary ductal dilation. Pancreas: No pancreatic ductal dilation or evidence of acute inflammation. No pancreatic divisum. No cystic or solid hyperenhancing pancreatic lesion visualized. Spleen:  Within normal limits in size and appearance. Adrenals/Urinary Tract: Bilateral adrenal glands are unremarkable. T2 hyperintense 1 cm left interpolar renal lesion which demonstrates 2 thin enhancing internal septations without wall thickening or enhancing nodularity consistent with a benign Bosniak classification 2 renal cysts. No solid enhancing renal lesion. No hydronephrosis. Stomach/Bowel: Visualized portions within the abdomen are unremarkable. Vascular/Lymphatic: No  pathologically enlarged lymph nodes identified. No abdominal aortic aneurysm demonstrated. Other:  No abdominal ascites.  Small fat containing ventral hernia. Musculoskeletal: No suspicious bone lesions identified. IMPRESSION: 1. Benign 1 cm Bosniak classification 2 renal cyst in the interpolar left kidney corresponding with the lesion seen on prior CT and ultrasound. No solid enhancing renal lesion. 2. Moderate diffuse hepatic steatosis. Electronically Signed   By: Maudry Mayhew M.D.   On: 05/02/2021 08:30   US RENAL  Result Date: 04/11/2021 CLINICAL DATA:  Follow-up left renal lesion.  Hypertension EXAM: RENAL / URINARY TRACT ULTRASOUND COMPLETE COMPARISON:  CT abdomen pelvis 10/13/2020 FINDINGS: Right Kidney: Renal measurements: 11.8 x 5.2 x 5.9 cm = volume: 155 mL. Echogenicity within normal limits. No mass or hydronephrosis visualized. Left Kidney: Renal measurements: 11.6 x 6.7 x 5 cm = volume: 203 mL. Solid 1.1 x 0.9 x 0.9 cm lesion. No mass or hydronephrosis visualized. Urinary bladder: Appears normal for degree of bladder distention. Other: Possible increased echogenicity of the hepatic parenchyma. IMPRESSION: 1. Solid 1.1 cm left renal lesion. Recommend MRI renal protocol further evaluation. 2. Possible hepatic steatosis. Electronically Signed   By: Tish Frederickson M.D.   On: 04/11/2021 23:57   DG Knee Right Port  Result Date: 05/08/2021 CLINICAL DATA:  Status post right knee replacement. EXAM: PORTABLE RIGHT KNEE - 1-2 VIEW COMPARISON:  Right knee x-ray 02/21/2021 FINDINGS: There is a new right knee total arthroplasty in anatomic alignment. No hardware loosening or fracture. There is anterior soft tissue swelling and air compatible with recent surgery. IMPRESSION: 1. 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 Electronically Signed   By: Darliss Cheney M.D.   On: 05/08/2021 15:27    Disposition: Discharge disposition: 01-Home or  Self Care          Follow-up Information      Tarry Kos, MD. Schedule an appointment as soon as possible for a visit in 2 week(s).   Specialty: Orthopedic Surgery Contact information: [address] [place] [address] [phone]                  Signed: Cristie Hem 05/09/2021, 8:25 AM

## 2021-05-09 NOTE — Progress Notes (Signed)
Physical Therapy Treatment Patient Details Name: Eugene Hardin MRN: [id number] DOB: 1960/09/06 Today's Date: 05/09/2021   History of Present Illness 60 y/o male s/p R TKA on 12/19. PMH includes HTN and fibromyalgia.    PT Comments    Pt tolerates treatment well, observed ambulating for laps in hallway prior to PT session and ambulating independently during session for shorter distances with walker. Pt negotiates steps at a modI level with use of railing. Pt expresses no concerns about mobility at home and demonstrates no further need for acute PT services. Acute PT signing off. Pt provided with HEP to continue until follow-up therapies are initiated.   Recommendations for follow up therapy are one component of a multi-disciplinary discharge planning process, led by the attending physician.  Recommendations may be updated based on patient status, additional functional criteria and insurance authorization.  Follow Up Recommendations  Follow physician's recommendations for discharge plan and follow up therapies     Assistance Recommended at Discharge Intermittent Supervision/Assistance  Equipment Recommendations  None recommended by PT    Recommendations for Other Services       Precautions / Restrictions Precautions Precautions: Knee Precaution Booklet Issued: No Precaution Comments: Verbally reviewed knee precautions Restrictions Weight Bearing Restrictions: Yes RLE Weight Bearing: Weight bearing as tolerated     Mobility  Bed Mobility Overal bed mobility: Modified Independent             General bed mobility comments: Mod A with increased time    Transfers Overall transfer level: Modified independent Equipment used: Rolling walker (2 wheels) Transfers: Sit to/from Stand Sit to Stand: Modified independent (Device/Increase time)           General transfer comment: Supervision for safety    Ambulation/Gait Ambulation/Gait assistance: Modified independent  (Device/Increase time) Gait Distance (Feet): 50 Feet (to stairs and back, pt observed ambulating laps before PT session and declines need for further ambulation currently) Assistive device: Rolling walker (2 wheels) Gait Pattern/deviations: Step-through pattern Gait velocity: functional Gait velocity interpretation: >2.62 ft/sec, indicative of community ambulatory   General Gait Details: pt with steady step-through gait   Stairs Stairs: Yes Stairs assistance: Modified independent (Device/Increase time) Stair Management: One rail Left;Step to pattern;Forwards Number of Stairs: 8 General stair comments: PT cues for stair technique initially   Wheelchair Mobility    Modified Rankin (Stroke Patients Only)       Balance Overall balance assessment: Independent Sitting-balance support: No upper extremity supported;Feet supported Sitting balance-Leahy Scale: Good     Standing balance support: No upper extremity supported;During functional activity Standing balance-Leahy Scale: Good Standing balance comment: pt performing ADLs at sink upon PT arrival                            Cognition Arousal/Alertness: Awake/alert Behavior During Therapy: WFL for tasks assessed/performed Overall Cognitive Status: Within Functional Limits for tasks assessed                                          Exercises Total Joint Exercises Goniometric ROM: Knee flexion AROM 75, PROM 85. Knee extension AROM -6, PROM -4    General Comments General comments (skin integrity, edema, etc.): VSS on RA      Pertinent Vitals/Pain Pain Assessment: Faces Faces Pain Scale: Hurts little more Pain Location: R knee Pain Descriptors / Indicators: Grimacing Pain  Intervention(s): Monitored during session    Home Living Family/patient expects to be discharged to:: Private residence Living Arrangements: Spouse/significant other Available Help at Discharge: Family Type of Home:  House Home Access: Stairs to enter Entrance Stairs-Rails: Psychiatric nurse of Steps: 5-6   Home Layout: One level Home Equipment: Conservation officer, nature (2 wheels)      Prior Function            PT Goals (current goals can now be found in the care plan section) Acute Rehab PT Goals Patient Stated Goal: to go home Progress towards PT goals: Goals met/education completed, patient discharged from PT    Frequency    7X/week      PT Plan Current plan remains appropriate    Co-evaluation              AM-PAC PT "6 Clicks" Mobility   Outcome Measure  Help needed turning from your back to your side while in a flat bed without using bedrails?: None Help needed moving from lying on your back to sitting on the side of a flat bed without using bedrails?: None Help needed moving to and from a bed to a chair (including a wheelchair)?: None Help needed standing up from a chair using your arms (e.g., wheelchair or bedside chair)?: None Help needed to walk in hospital room?: None Help needed climbing 3-5 steps with a railing? : None 6 Click Score: 24    End of Session   Activity Tolerance: Patient tolerated treatment well Patient left: in chair;with call bell/phone within reach Nurse Communication: Mobility status PT Visit Diagnosis: Other abnormalities of gait and mobility (R26.89);Pain Pain - Right/Left: Right Pain - part of body: Knee     Time: 3729-0211 PT Time Calculation (min) (ACUTE ONLY): 8 min  Charges:  $Gait Training: 8-22 mins                     Zenaida Niece, PT, DPT Acute Rehabilitation Pager: [phone] Office [phone]    Zenaida Niece 05/09/2021, 11:04 AM

## 2021-05-10 ENCOUNTER — Other Ambulatory Visit: Payer: Self-pay

## 2021-05-10 ENCOUNTER — Ambulatory Visit (INDEPENDENT_AMBULATORY_CARE_PROVIDER_SITE_OTHER): Payer: 59 | Admitting: Orthopaedic Surgery

## 2021-05-10 DIAGNOSIS — Z96651 Presence of right artificial knee joint: Secondary | ICD-10-CM

## 2021-05-10 MED ORDER — HYDROCODONE-ACETAMINOPHEN 10-325 MG PO TABS: 1.0000 | ORAL_TABLET | Freq: Every day | ORAL | 0 refills | Status: DC | PRN

## 2021-05-11 ENCOUNTER — Telehealth: Payer: Self-pay | Admitting: Orthopaedic Surgery

## 2021-05-11 ENCOUNTER — Telehealth: Payer: Self-pay

## 2021-05-11 NOTE — Telephone Encounter (Signed)
Spoke to patient and reassurance was provided.

## 2021-05-11 NOTE — Telephone Encounter (Signed)
Eugene Hardin, patients wife called stating patient has some redness and swelling at the knee, she is wondering about MRSA issue she said he has had in the past She is wondering if something can be called in but I told her he might want patient to be seen but that I would send you a message [phone]

## 2021-05-11 NOTE — Progress Notes (Signed)
° °  Post-Op Visit Note   Patient: Eugene Hardin           Date of Birth: 12-06-1960           MRN: [id number] Visit Date: 05/10/2021 PCP: Blair Heys, MD   Assessment & Plan:  Chief Complaint:  Chief Complaint  Patient presents with   Right Knee - Pain, Routine Post Op   Visit Diagnoses:  1. Status post total knee replacement, right     Plan: Eugene Hardin comes in today for wound check.  He is concerned about some bloody spots on the dressing.  Surgical incision is intact without any drainage or evidence of infection.  There is the appropriate amount of postsurgical swelling and edema.  Reassurance was provided and new surgical bandage was applied today.  We will see her back as scheduled.  Hydrocodone refilled today.  Follow-Up Instructions: Return for as scheduled.   Orders:  No orders of the defined types were placed in this encounter.  Meds ordered this encounter  Medications   HYDROcodone-acetaminophen (NORCO) 10-325 MG tablet    Sig: Take 1-2 tablets by mouth daily as needed for moderate pain or severe pain.    Dispense:  30 tablet    Refill:  0    Imaging: No results found.  PMFS History: Patient Active Problem List   Diagnosis Date Noted   Status post total knee replacement, right 05/08/2021   Primary osteoarthritis of right knee 05/09/2020   Past Medical History:  Diagnosis Date   Anxiety    Arthritis    Back pain    Chronic pain    Depression    Fibromyalgia    GERD (gastroesophageal reflux disease)    Hyperlipidemia    Hypertension    Neuropathy    Pneumonia     Family History  Problem Relation Age of Onset   Brain cancer Mother    Diabetes Father     Past Surgical History:  Procedure Laterality Date   CARPAL TUNNEL RELEASE Left    MANDIBLE RECONSTRUCTION     SHOULDER SURGERY Left    TOTAL KNEE ARTHROPLASTY Right 05/08/2021   Procedure: RIGHT TOTAL KNEE ARTHROPLASTY;  Surgeon: Tarry Kos, MD;  Location: [facility];  Service:  Orthopedics;  Laterality: Right;   WOUND EXPLORATION Right 01/11/2016   Procedure: RIGHT WRIST EXPLORATION, RIGHT WRIST NERVE, TENDON AND ARTERY  REPAIR;  Surgeon: Dairl Ponder, MD;  Location: [facility];  Service: Orthopedics;  Laterality: Right;   Social History   Occupational History   Not on file  Tobacco Use   Smoking status: Never   Smokeless tobacco: Never  Vaping Use   Vaping Use: Never used  Substance and Sexual Activity   Alcohol use: Yes    Comment: social   Drug use: No   Sexual activity: Not on file

## 2021-05-11 NOTE — Telephone Encounter (Signed)
Called patient no answer. LMOM.  Need to know if he is set up with HHPT?  [facility] does not accept his ins. Need to know if he is set up with another agency ([facility] should of taken care of this). Just want to make sure.

## 2021-05-11 NOTE — Telephone Encounter (Signed)
Leg is swollen to hip and hot to the touch. Please call asap. Please call wife Zella Ball as soon as possible at [phone].

## 2021-05-24 ENCOUNTER — Ambulatory Visit (INDEPENDENT_AMBULATORY_CARE_PROVIDER_SITE_OTHER): Payer: 59 | Admitting: Orthopaedic Surgery

## 2021-05-24 ENCOUNTER — Other Ambulatory Visit: Payer: Self-pay

## 2021-05-24 ENCOUNTER — Encounter: Payer: Self-pay | Admitting: Orthopaedic Surgery

## 2021-05-24 DIAGNOSIS — Z96651 Presence of right artificial knee joint: Secondary | ICD-10-CM

## 2021-05-24 NOTE — Progress Notes (Signed)
° °  Post-Op Visit Note   Patient: Eugene Hardin           Date of Birth: 1961-01-02           MRN: [id number] Visit Date: 05/24/2021 PCP: Blair Heys, MD   Assessment & Plan:  Chief Complaint:  Chief Complaint  Patient presents with   Right Knee - Pain   Visit Diagnoses:  1. Hx of total knee replacement, right     Plan: = Patient is a pleasant 60 year old gentleman who comes in today 2 weeks status post right total knee replacement 05/08/2021.  He has been doing well.  He has been getting home health physical therapy and is ambulating with a cane.  He has minimal pain.  He has only been taking 1 baby aspirin daily.  No chest pain or shortness of breath.  No calf pain.  Examination of the right knee reveals a well healed surgical incision with nylon sutures in place.  No evidence of infection or cellulitis.  Calf is soft and nontender.  He is neurovascular intact distally.  Today, sutures were removed and Steri-Strips applied.  He will continue his baby aspirin twice daily for another 4 weeks.  We will go ahead and transition him to outpatient physical therapy and an internal referral made.  Follow-up with [place] in 4 weeks time for repeat evaluation and 2 view x-rays of the right knee.  Call with concerns or questions.  Follow-Up Instructions: Return in about 4 weeks (around 06/21/2021).   Orders:  Orders Placed This Encounter  Procedures   Ambulatory referral to Physical Therapy   No orders of the defined types were placed in this encounter.   Imaging: No new imaging  PMFS History: Patient Active Problem List   Diagnosis Date Noted   Status post total knee replacement, right 05/08/2021   Primary osteoarthritis of right knee 05/09/2020   Past Medical History:  Diagnosis Date   Anxiety    Arthritis    Back pain    Chronic pain    Depression    Fibromyalgia    GERD (gastroesophageal reflux disease)    Hyperlipidemia    Hypertension    Neuropathy    Pneumonia      Family History  Problem Relation Age of Onset   Brain cancer Mother    Diabetes Father     Past Surgical History:  Procedure Laterality Date   CARPAL TUNNEL RELEASE Left    MANDIBLE RECONSTRUCTION     SHOULDER SURGERY Left    TOTAL KNEE ARTHROPLASTY Right 05/08/2021   Procedure: RIGHT TOTAL KNEE ARTHROPLASTY;  Surgeon: Tarry Kos, MD;  Location: [facility];  Service: Orthopedics;  Laterality: Right;   WOUND EXPLORATION Right 01/11/2016   Procedure: RIGHT WRIST EXPLORATION, RIGHT WRIST NERVE, TENDON AND ARTERY  REPAIR;  Surgeon: Dairl Ponder, MD;  Location: [facility];  Service: Orthopedics;  Laterality: Right;   Social History   Occupational History   Not on file  Tobacco Use   Smoking status: Never   Smokeless tobacco: Never  Vaping Use   Vaping Use: Never used  Substance and Sexual Activity   Alcohol use: Yes    Comment: social   Drug use: No   Sexual activity: Not on file

## 2021-05-31 ENCOUNTER — Other Ambulatory Visit: Payer: Self-pay

## 2021-05-31 ENCOUNTER — Encounter: Payer: Self-pay | Admitting: Physical Therapy

## 2021-05-31 ENCOUNTER — Ambulatory Visit (INDEPENDENT_AMBULATORY_CARE_PROVIDER_SITE_OTHER): Payer: Managed Care, Other (non HMO) | Admitting: Physical Therapy

## 2021-05-31 DIAGNOSIS — M6281 Muscle weakness (generalized): Secondary | ICD-10-CM | POA: Diagnosis not present

## 2021-05-31 DIAGNOSIS — M25561 Pain in right knee: Secondary | ICD-10-CM | POA: Diagnosis not present

## 2021-05-31 DIAGNOSIS — R600 Localized edema: Secondary | ICD-10-CM

## 2021-05-31 DIAGNOSIS — M25661 Stiffness of right knee, not elsewhere classified: Secondary | ICD-10-CM

## 2021-05-31 DIAGNOSIS — R2681 Unsteadiness on feet: Secondary | ICD-10-CM

## 2021-05-31 DIAGNOSIS — R2689 Other abnormalities of gait and mobility: Secondary | ICD-10-CM

## 2021-05-31 NOTE — Therapy (Signed)
[facility] Physical Therapy [address] [place], [place], [address] Phone: [phone]   Fax:  [phone]  Physical Therapy Evaluation  Patient Details  Name: Eugene Hardin MRN: [id number] Date of Birth: May 03, 1961 Referring Provider (PT): Cristie Hem, [place]   Encounter Date: 05/31/2021   PT End of Session - 05/31/21 1347     Visit Number 1    Number of Visits 16    Date for PT Re-Evaluation 07/21/21    Authorization Type [employer]    PT Start Time 1300    PT Stop Time 1344    PT Time Calculation (min) 44 min    Activity Tolerance Patient tolerated treatment well;Patient limited by pain    Behavior During Therapy [facility] for tasks assessed/performed             Past Medical History:  Diagnosis Date   Anxiety    Arthritis    Back pain    Chronic pain    Depression    Fibromyalgia    GERD (gastroesophageal reflux disease)    Hyperlipidemia    Hypertension    Neuropathy    Pneumonia     Past Surgical History:  Procedure Laterality Date   CARPAL TUNNEL RELEASE Left    MANDIBLE RECONSTRUCTION     SHOULDER SURGERY Left    TOTAL KNEE ARTHROPLASTY Right 05/08/2021   Procedure: RIGHT TOTAL KNEE ARTHROPLASTY;  Surgeon: Tarry Kos, MD;  Location: [facility] OR;  Service: Orthopedics;  Laterality: Right;   WOUND EXPLORATION Right 01/11/2016   Procedure: RIGHT WRIST EXPLORATION, RIGHT WRIST NERVE, TENDON AND ARTERY  REPAIR;  Surgeon: Dairl Ponder, MD;  Location: [facility];  Service: Orthopedics;  Laterality: Right;    There were no vitals filed for this visit.    Subjective Assessment - 05/31/21 1305     Subjective This 60yo male was referred to PT by Cristie Hem, PA with [phone] (ICD-10-CM) - Hx of total knee replacement, right which was performed on 05/08/2021 due to OA.    Pertinent History OA, anxiety, back pain, fibromyalgia, HTN, neuropathy,    Patient Stated Goals He get back to normal life including moving in community  without pain.    Currently in Pain? Yes    Pain Score 3    In last week, least 1/10 and highest 5/10   Pain Location Knee    Pain Orientation Right    Pain Descriptors / Indicators Aching;Sore    Pain Type Surgical pain    Pain Onset 1 to 4 weeks ago    Pain Frequency Intermittent    Aggravating Factors  walking, standing    Pain Relieving Factors ice, meds, compression sock    Effect of Pain on Daily Activities standing & walking    Multiple Pain Sites Yes    Pain Score --   no difference in LBP since knee surgery,  ranges from 3/10 to 8-9/10   Pain Location Back                [facility] PT Assessment - 05/31/21 1300       Assessment   Medical Diagnosis Z96.651 (ICD-10-CM) - Hx of total knee replacement, right    Referring Provider (PT) Cristie Hem, PA    Onset Date/Surgical Date 05/08/21    Hand Dominance Right    Prior Therapy [facility] thru 05/18/2021      Precautions   Precautions Fall      Balance Screen   Has the  patient fallen in the past 6 months Yes    How many times? 2   no injuries   Has the patient had a decrease in activity level because of a fear of falling?  No    Is the patient reluctant to leave their home because of a fear of falling?  No      Home Environment   Living Environment Private residence    Living Arrangements Spouse/significant other   pets 6 dogs (~5#) and cat   Type of Home House    Home Access Stairs to enter    Entrance Stairs-Number of Steps 5    Entrance Stairs-Rails Right;Left;Cannot reach both    Home Layout One level    Home Equipment Inger - single point;Walker - 2 wheels      Prior Function   Level of Independence Independent;Independent with household mobility without device;Independent with community mobility without device    Vocation Unemployed      Observation/Other Assessments   Focus on Therapeutic Outcomes (FOTO)  51% functional with 64% target      Observation/Other Assessments-Edema    Edema Circumferential       Circumferential Edema   Circumferential - Right above knee 49.0cm, around knee 43.0cm, below knee 37.5cm    Circumferential - Left  above knee 43.5cm, around knee 39.5cm, below knee 35.5cm      Functional Tests   Functional tests Single leg stance      Single Leg Stance   Comments RLE = 14sec  LLE = 29sec      ROM / Strength   AROM / PROM / Strength AROM;PROM;Strength      AROM   AROM Assessment Site Knee    Right/Left Knee Right    Right Knee Extension -11   seated LAQ   Right Knee Flexion 83   standing antigravity     PROM   PROM Assessment Site Knee    Right/Left Knee Right    Right Knee Extension -8   supine   Right Knee Flexion 103   seated     Strength   Strength Assessment Site Knee    Right/Left Knee Right    Right Knee Flexion 3-/5    Right Knee Extension 3-/5      Ambulation/Gait   Ambulation/Gait Yes    Ambulation/Gait Assistance 5: Supervision    Assistive device Straight cane                        Objective measurements completed on examination: See above findings.       [facility] Adult PT Treatment/Exercise - 05/31/21 1300       Ambulation/Gait   Gait Pattern Step-through pattern;Decreased arm swing - right;Decreased step length - left;Decreased stance time - right;Decreased hip/knee flexion - right;Decreased weight shift to right;Right hip hike;Right flexed knee in stance;Antalgic    Ambulation Surface Level;Indoor      Exercises   Exercises Knee/Hip;Other Exercises    Other Exercises  HEP [email or web address]  PT instructed & pt performed 1 set of each.                     PT Education - 05/31/21 1336     Education Details Access Code: [email or web address]    Person(s) Educated Patient    Methods Explanation;Demonstration;Tactile cues;Verbal cues;Handout    Comprehension Verbalized understanding;Returned demonstration;Verbal cues required;Tactile cues required;Need further instruction  PT Short Term Goals - 05/31/21  1352       PT SHORT TERM GOAL #1   Title Right knee PROM -3* extension to 105* flexion    Time 4    Period Weeks    Status New    Target Date 06/23/21      PT SHORT TERM GOAL #2   Title Right knee pain </= 4/10 with standing & gait    Time 4    Period Weeks    Status New    Target Date 06/23/21      PT SHORT TERM GOAL #3   Title Patient demonstrates & verbalized understanding of initial HEP    Time 4    Period Weeks    Status New    Target Date 06/23/21               PT Long Term Goals - 05/31/21 1349       PT LONG TERM GOAL #1   Title FOTO score >/= 64%    Time 8    Period Weeks    Status New    Target Date 07/21/21      PT LONG TERM GOAL #2   Title right Knee pain </= 2/10 with standing & gait activities    Time 8    Period Weeks    Status New    Target Date 07/21/21      PT LONG TERM GOAL #3   Title Right knee AROM antigravity extension -2* and flexion 95*    Time 8    Period Weeks    Status New    Target Date 07/21/21      PT LONG TERM GOAL #4   Title Right Knee PROM 0* extension to 110* flexion    Time 8    Period Weeks    Status New    Target Date 07/21/21      PT LONG TERM GOAL #5   Title Patient reports ambulating in community including ramps, curbs & stairs without device independently.    Time 8    Period Weeks    Status New    Target Date 07/21/21                    Plan - 05/31/21 1336     Clinical Impression Statement Pt is a 60y/o male who presents to [facility] s/p Rt TKA on 05/08/21.  She demonstrates decreased strength, ROM, increased edema and pain with gait abnormalities affecting functional mobility.  Pt will benefit from PT to address deficits listed.    Personal Factors and Comorbidities Comorbidity 3+;Fitness    Comorbidities OA, anxiety, back pain, fibromyalgia, HTN, neuropathy,    Examination-Activity Limitations Lift;Locomotion Level;Sleep;Squat;Stairs;Stand    Examination-Participation Restrictions Community  Activity    Stability/Clinical Decision Making Stable/Uncomplicated    Clinical Decision Making Low    Rehab Potential Good    PT Frequency 2x / week    PT Duration 8 weeks    PT Treatment/Interventions ADLs/Self Care Home Management;DME Instruction;Gait training;Stair training;Functional mobility training;Therapeutic activities;Therapeutic exercise;Balance training;Neuromuscular re-education;Patient/family education;Manual techniques;Electrical Stimulation;Moist Heat;Cryotherapy;Scar mobilization;Passive range of motion;Vasopneumatic Device;Joint Manipulations    PT Next Visit Plan check & updated HEP, manual therapy and exercises to increase range & strength, vaso for edema    PT Home Exercise Plan Access Code: [email or web address]    Consulted and Agree with Plan of Care Patient             Patient will benefit from  skilled therapeutic intervention in order to improve the following deficits and impairments:  Abnormal gait, Decreased activity tolerance, Decreased balance, Decreased endurance, Decreased mobility, Decreased range of motion, Decreased skin integrity, Decreased scar mobility, Decreased strength, Increased edema, Obesity, Pain, Postural dysfunction  Visit Diagnosis: Acute pain of right knee  Stiffness of right knee, not elsewhere classified  Muscle weakness (generalized)  Localized edema  Other abnormalities of gait and mobility  Unsteadiness on feet     Problem List Patient Active Problem List   Diagnosis Date Noted   Status post total knee replacement, right 05/08/2021   Primary osteoarthritis of right knee 05/09/2020    Vladimir Fasterobin Adeleigh Barletta, PT, DPT 05/31/2021, 1:55 PM  [facility] [address] [place], [place], [address] Phone: [phone]   Fax:  [phone]  Name: Eugene Hardin MRN: [id number] Date of Birth: 11/13/1960

## 2021-05-31 NOTE — Patient Instructions (Signed)
Access Code: [email or web address] URL: [email or web address] Date: 05/31/2021 Prepared by: Vladimir Faster  Exercises Quad Setting and Stretching - 2-4 x daily - 7 x weekly - 5-10 sets - 10 reps - prop 5-10 minutes & quad set5 seconds hold Supine Leg Press - 2-4 x daily - 7 x weekly - 5-10 sets - 10 reps - 5 seconds hold Ankle Alphabet in Elevation - 2-4 x daily - 7 x weekly - 1 sets - 1 reps Supine Heel Slide with Strap - 2-3 x daily - 7 x weekly - 2-3 sets - 10 reps - 5 seconds hold Supine Straight Leg Raises - 2-3 x daily - 7 x weekly - 2-3 sets - 10 reps - 5 seconds hold Seated Knee Flexion Extension AROM - 2-4 x daily - 7 x weekly - 2-3 sets - 10 reps - 5 seconds hold Seated Hamstring Stretch with Strap - 2-4 x daily - 7 x weekly - 1 sets - 3 reps - 20-30 seconds hold

## 2021-06-05 ENCOUNTER — Ambulatory Visit (INDEPENDENT_AMBULATORY_CARE_PROVIDER_SITE_OTHER): Payer: Managed Care, Other (non HMO) | Admitting: Physical Therapy

## 2021-06-05 ENCOUNTER — Other Ambulatory Visit: Payer: Self-pay

## 2021-06-05 ENCOUNTER — Encounter: Payer: Self-pay | Admitting: Physical Therapy

## 2021-06-05 DIAGNOSIS — M6281 Muscle weakness (generalized): Secondary | ICD-10-CM | POA: Diagnosis not present

## 2021-06-05 DIAGNOSIS — M25661 Stiffness of right knee, not elsewhere classified: Secondary | ICD-10-CM

## 2021-06-05 DIAGNOSIS — R2681 Unsteadiness on feet: Secondary | ICD-10-CM

## 2021-06-05 DIAGNOSIS — M25561 Pain in right knee: Secondary | ICD-10-CM | POA: Diagnosis not present

## 2021-06-05 DIAGNOSIS — R2689 Other abnormalities of gait and mobility: Secondary | ICD-10-CM

## 2021-06-05 DIAGNOSIS — R600 Localized edema: Secondary | ICD-10-CM | POA: Diagnosis not present

## 2021-06-05 NOTE — Therapy (Signed)
[facility] Physical Therapy [address] [place], [place], [address] Phone: [phone]   Fax:  [phone]  Physical Therapy Treatment  Patient Details  Name: Eugene Hardin MRN: [id number] Date of Birth: 1961-02-28 Referring Provider (PT): Cristie Hem, [place]   Encounter Date: 06/05/2021   PT End of Session - 06/05/21 1307     Visit Number 2    Number of Visits 16    Date for PT Re-Evaluation 07/21/21    Authorization Type [employer]    PT Start Time 1300    PT Stop Time 1352    PT Time Calculation (min) 52 min    Activity Tolerance Patient tolerated treatment well;Patient limited by pain    Behavior During Therapy [facility] for tasks assessed/performed             Past Medical History:  Diagnosis Date   Anxiety    Arthritis    Back pain    Chronic pain    Depression    Fibromyalgia    GERD (gastroesophageal reflux disease)    Hyperlipidemia    Hypertension    Neuropathy    Pneumonia     Past Surgical History:  Procedure Laterality Date   CARPAL TUNNEL RELEASE Left    MANDIBLE RECONSTRUCTION     SHOULDER SURGERY Left    TOTAL KNEE ARTHROPLASTY Right 05/08/2021   Procedure: RIGHT TOTAL KNEE ARTHROPLASTY;  Surgeon: Tarry Kos, MD;  Location: [facility] OR;  Service: Orthopedics;  Laterality: Right;   WOUND EXPLORATION Right 01/11/2016   Procedure: RIGHT WRIST EXPLORATION, RIGHT WRIST NERVE, TENDON AND ARTERY  REPAIR;  Surgeon: Dairl Ponder, MD;  Location: [facility];  Service: Orthopedics;  Laterality: Right;    There were no vitals filed for this visit.   Subjective Assessment - 06/05/21 1300     Subjective He was able to walk upstairs today.  His leg has been swelling.  He has been elevating & use home ice machine.    Pertinent History OA, anxiety, back pain, fibromyalgia, HTN, neuropathy,    Patient Stated Goals He get back to normal life including moving in community without pain.    Currently in Pain? Yes    Pain Score 3     since last PT session,  lowest 1/10 and highest 5/10   Pain Location Knee    Pain Orientation Right    Pain Descriptors / Indicators Aching;Sore    Pain Type Surgical pain    Pain Onset 1 to 4 weeks ago    Pain Frequency Intermittent    Aggravating Factors  walking, standing    Pain Relieving Factors ice, meds, compression sock    Effect of Pain on Daily Activities standing & walking                               [facility] Adult PT Treatment/Exercise - 06/05/21 1300       Ambulation/Gait   Ambulation/Gait Yes    Ambulation/Gait Assistance 5: Supervision    Assistive device Straight cane    Gait Pattern Step-through pattern;Decreased arm swing - right;Decreased step length - left;Decreased stance time - right;Decreased hip/knee flexion - right;Decreased weight shift to right;Right hip hike;Right flexed knee in stance;Antalgic      Exercises   Exercises Knee/Hip;Other Exercises    Other Exercises  --      Knee/Hip Exercises: Stretches   Active Hamstring Stretch Right;3 reps;30 seconds  Active Hamstring Stretch Limitations seated LE ext on mat, trunk flexion    Quad Stretch Right;3 reps;30 seconds    Quad Stretch Limitations supine hooklying with LE over edge strap flexion    Gastroc Stretch Right;3 reps;30 seconds    Gastroc Stretch Limitations incline board      Knee/Hip Exercises: Aerobic   Recumbent Bike seat 2 level 1 for 8 min      Knee/Hip Exercises: Machines for Strengthening   Total Gym Leg Press Shuttle leg press BLEs 125# 15 reps 1 set,  RLE 62# 15 reps      Knee/Hip Exercises: Seated   Heel Slides AROM;Right;20 reps      Modalities   Modalities Vasopneumatic      Vasopneumatic   Number Minutes Vasopneumatic  10 minutes    Vasopnuematic Location  Knee    Vasopneumatic Pressure Medium    Vasopneumatic Temperature  34      Manual Therapy   Manual Therapy Joint mobilization    Manual therapy comments knee flexion seated gentle traction  with mild tibial int rot and knee extension with femoral int rot                       PT Short Term Goals - 05/31/21 1352       PT SHORT TERM GOAL #1   Title Right knee PROM -3* extension to 105* flexion    Time 4    Period Weeks    Status New    Target Date 06/23/21      PT SHORT TERM GOAL #2   Title Right knee pain </= 4/10 with standing & gait    Time 4    Period Weeks    Status New    Target Date 06/23/21      PT SHORT TERM GOAL #3   Title Patient demonstrates & verbalized understanding of initial HEP    Time 4    Period Weeks    Status New    Target Date 06/23/21               PT Long Term Goals - 05/31/21 1349       PT LONG TERM GOAL #1   Title FOTO score >/= 64%    Time 8    Period Weeks    Status New    Target Date 07/21/21      PT LONG TERM GOAL #2   Title right Knee pain </= 2/10 with standing & gait activities    Time 8    Period Weeks    Status New    Target Date 07/21/21      PT LONG TERM GOAL #3   Title Right knee AROM antigravity extension -2* and flexion 95*    Time 8    Period Weeks    Status New    Target Date 07/21/21      PT LONG TERM GOAL #4   Title Right Knee PROM 0* extension to 110* flexion    Time 8    Period Weeks    Status New    Target Date 07/21/21      PT LONG TERM GOAL #5   Title Patient reports ambulating in community including ramps, curbs & stairs without device independently.    Time 8    Period Weeks    Status New    Target Date 07/21/21  Plan - 06/05/21 1307     Clinical Impression Statement PT progressed intensity of knee exercises which he tolerated with increased pain.  Pt improved ROM and strength with PT instruction.  He continues to benefit from skilled PT to improve his function & mobility.    Personal Factors and Comorbidities Comorbidity 3+;Fitness    Comorbidities OA, anxiety, back pain, fibromyalgia, HTN, neuropathy,    Examination-Activity  Limitations Lift;Locomotion Level;Sleep;Squat;Stairs;Stand    Examination-Participation Restrictions Community Activity    Stability/Clinical Decision Making Stable/Uncomplicated    Rehab Potential Good    PT Frequency 2x / week    PT Duration 8 weeks    PT Treatment/Interventions ADLs/Self Care Home Management;DME Instruction;Gait training;Stair training;Functional mobility training;Therapeutic activities;Therapeutic exercise;Balance training;Neuromuscular re-education;Patient/family education;Manual techniques;Electrical Stimulation;Moist Heat;Cryotherapy;Scar mobilization;Passive range of motion;Vasopneumatic Device;Joint Manipulations    PT Next Visit Plan manual therapy and exercises to increase range & strength, vaso for edema    PT Home Exercise Plan Access Code: [email or web address]    Consulted and Agree with Plan of Care Patient             Patient will benefit from skilled therapeutic intervention in order to improve the following deficits and impairments:  Abnormal gait, Decreased activity tolerance, Decreased balance, Decreased endurance, Decreased mobility, Decreased range of motion, Decreased skin integrity, Decreased scar mobility, Decreased strength, Increased edema, Obesity, Pain, Postural dysfunction  Visit Diagnosis: Acute pain of right knee  Muscle weakness (generalized)  Localized edema  Stiffness of right knee, not elsewhere classified  Other abnormalities of gait and mobility  Unsteadiness on feet     Problem List Patient Active Problem List   Diagnosis Date Noted   Status post total knee replacement, right 05/08/2021   Primary osteoarthritis of right knee 05/09/2020    Vladimir Faster, PT, DPT 06/05/2021, 1:44 PM  [facility] Physical Therapy [address] [place], [place], [address] Phone: [phone]   Fax:  [phone]  Name: Eugene Hardin MRN: [id number] Date of Birth: 1960-05-29

## 2021-06-09 ENCOUNTER — Encounter: Payer: Self-pay | Admitting: Rehabilitative and Restorative Service Providers"

## 2021-06-09 ENCOUNTER — Other Ambulatory Visit: Payer: Self-pay

## 2021-06-09 ENCOUNTER — Ambulatory Visit (INDEPENDENT_AMBULATORY_CARE_PROVIDER_SITE_OTHER): Payer: Managed Care, Other (non HMO) | Admitting: Rehabilitative and Restorative Service Providers"

## 2021-06-09 DIAGNOSIS — M25561 Pain in right knee: Secondary | ICD-10-CM | POA: Diagnosis not present

## 2021-06-09 DIAGNOSIS — R600 Localized edema: Secondary | ICD-10-CM

## 2021-06-09 DIAGNOSIS — M25661 Stiffness of right knee, not elsewhere classified: Secondary | ICD-10-CM

## 2021-06-09 DIAGNOSIS — M6281 Muscle weakness (generalized): Secondary | ICD-10-CM

## 2021-06-09 DIAGNOSIS — R2689 Other abnormalities of gait and mobility: Secondary | ICD-10-CM

## 2021-06-09 NOTE — Patient Instructions (Signed)
Access Code: [email or web address] URL: [email or web address] Date: 06/09/2021 Prepared by: Pauletta Browns  Exercises Quad Setting and Stretching - 2-4 x daily - 7 x weekly - 5-10 sets - 10 reps - prop 5-10 minutes & quad set5 seconds hold Supine Leg Press - 2-4 x daily - 7 x weekly - 5-10 sets - 10 reps - 5 seconds hold Ankle Alphabet in Elevation - 2-4 x daily - 7 x weekly - 1 sets - 1 reps Supine Heel Slide with Strap - 2-3 x daily - 7 x weekly - 2-3 sets - 10 reps - 5 seconds hold Supine Straight Leg Raises - 2-3 x daily - 7 x weekly - 2-3 sets - 10 reps - 5 seconds hold Seated Knee Flexion Extension AROM - 2-4 x daily - 7 x weekly - 2-3 sets - 10 reps - 5 seconds hold Seated Hamstring Stretch with Strap - 2-4 x daily - 7 x weekly - 1 sets - 3 reps - 20-30 seconds hold

## 2021-06-09 NOTE — Therapy (Signed)
[facility] [address] [place], [place], [address] Phone: [phone]   Fax:  [phone]  Physical Therapy Treatment  Patient Details  Name: Eugene Hardin MRN: [id number] Date of Birth: November 08, 1960 Referring Provider (PT): Cristie Hem, [place]   Encounter Date: 06/09/2021   PT End of Session - 06/09/21 1538     Visit Number 3    Number of Visits 16    Date for PT Re-Evaluation 07/21/21    Authorization Type [employer]    PT Start Time 1348    PT Stop Time 1438    PT Time Calculation (min) 50 min    Activity Tolerance Patient tolerated treatment well    Behavior During Therapy WFL for tasks assessed/performed             Past Medical History:  Diagnosis Date   Anxiety    Arthritis    Back pain    Chronic pain    Depression    Fibromyalgia    GERD (gastroesophageal reflux disease)    Hyperlipidemia    Hypertension    Neuropathy    Pneumonia     Past Surgical History:  Procedure Laterality Date   CARPAL TUNNEL RELEASE Left    MANDIBLE RECONSTRUCTION     SHOULDER SURGERY Left    TOTAL KNEE ARTHROPLASTY Right 05/08/2021   Procedure: RIGHT TOTAL KNEE ARTHROPLASTY;  Surgeon: Tarry Kos, MD;  Location: [facility] OR;  Service: Orthopedics;  Laterality: Right;   WOUND EXPLORATION Right 01/11/2016   Procedure: RIGHT WRIST EXPLORATION, RIGHT WRIST NERVE, TENDON AND ARTERY  REPAIR;  Surgeon: Dairl Ponder, MD;  Location: [facility];  Service: Orthopedics;  Laterality: Right;    There were no vitals filed for this visit.   Subjective Assessment - 06/09/21 1351     Subjective Eugene Hardin is sleeping better with hydrocodone PRN.  He is happy with his early progress.    Pertinent History OA, anxiety, back pain, fibromyalgia, HTN, neuropathy,    Limitations Walking;Lifting;House hold activities;Standing    How long can you sit comfortably? Depends on the day.  Good day 1.5-2 hours.    How long can you stand comfortably? 30  minutes    How long can you walk comfortably? 5-10 minutes    Patient Stated Goals He get back to normal life including moving in community without pain.    Currently in Pain? Yes    Pain Score 3     Pain Location Knee    Pain Orientation Right    Pain Descriptors / Indicators Aching;Tightness    Pain Type Surgical pain    Pain Radiating Towards NA    Pain Onset More than a month ago    Pain Frequency Intermittent    Aggravating Factors  WB too long    Pain Relieving Factors Pain meds, ice, compression sock    Effect of Pain on Daily Activities Standing and walking endurance and comfort are limited                               [facility] Adult PT Treatment/Exercise - 06/09/21 0001       Therapeutic Activites    Therapeutic Activities ADL's    ADL's Step-up and over 4", 6" and 8" step focus on slow eccentrics (no hands)      Neuro Re-ed    Neuro Re-ed Details  Tandem balance: foam; floor eyes open; head turning side to  side; eyes closed 2X 30 seconds each      Exercises   Exercises Knee/Hip;Other Exercises      Knee/Hip Exercises: Aerobic   Recumbent Bike Seat 2 for 8 minutes      Knee/Hip Exercises: Machines for Strengthening   Total Gym Leg Press 125# double leg push into extension, slow eccentrics & 75# R leg only 10X same instructions      Modalities   Modalities Vasopneumatic      Vasopneumatic   Number Minutes Vasopneumatic  10 minutes    Vasopnuematic Location  Knee    Vasopneumatic Pressure Medium    Vasopneumatic Temperature  34                     PT Education - 06/09/21 1538     Education Details Discussed HEP, progressed balance, functional stairs and resistance with leg press for sit to stand and stairs.    Person(s) Educated Patient    Methods Explanation;Demonstration;Tactile cues;Verbal cues    Comprehension Verbalized understanding;Need further instruction;Tactile cues required;Returned demonstration;Verbal cues required               PT Short Term Goals - 05/31/21 1352       PT SHORT TERM GOAL #1   Title Right knee PROM -3* extension to 105* flexion    Time 4    Period Weeks    Status New    Target Date 06/23/21      PT SHORT TERM GOAL #2   Title Right knee pain </= 4/10 with standing & gait    Time 4    Period Weeks    Status New    Target Date 06/23/21      PT SHORT TERM GOAL #3   Title Patient demonstrates & verbalized understanding of initial HEP    Time 4    Period Weeks    Status New    Target Date 06/23/21               PT Long Term Goals - 05/31/21 1349       PT LONG TERM GOAL #1   Title FOTO score >/= 64%    Time 8    Period Weeks    Status New    Target Date 07/21/21      PT LONG TERM GOAL #2   Title right Knee pain </= 2/10 with standing & gait activities    Time 8    Period Weeks    Status New    Target Date 07/21/21      PT LONG TERM GOAL #3   Title Right knee AROM antigravity extension -2* and flexion 95*    Time 8    Period Weeks    Status New    Target Date 07/21/21      PT LONG TERM GOAL #4   Title Right Knee PROM 0* extension to 110* flexion    Time 8    Period Weeks    Status New    Target Date 07/21/21      PT LONG TERM GOAL #5   Title Patient reports ambulating in community including ramps, curbs & stairs without device independently.    Time 8    Period Weeks    Status New    Target Date 07/21/21                   Plan - 06/09/21 1539     Clinical Impression Statement Eugene Hardin  is giving great effort with his early PT.  He is walking without an assistive device and did a good job with early balance and functional activities.  Continue quadriceps strength, balance and functional work to meet long-term goals.    Personal Factors and Comorbidities Comorbidity 3+;Fitness    Comorbidities OA, anxiety, back pain, fibromyalgia, HTN, neuropathy,    Examination-Activity Limitations Lift;Locomotion Level;Sleep;Squat;Stairs;Stand     Examination-Participation Restrictions Community Activity    Stability/Clinical Decision Making Stable/Uncomplicated    Rehab Potential Good    PT Frequency 2x / week    PT Duration 8 weeks    PT Treatment/Interventions ADLs/Self Care Home Management;DME Instruction;Gait training;Stair training;Functional mobility training;Therapeutic activities;Therapeutic exercise;Balance training;Neuromuscular re-education;Patient/family education;Manual techniques;Electrical Stimulation;Moist Heat;Cryotherapy;Scar mobilization;Passive range of motion;Vasopneumatic Device;Joint Manipulations    PT Next Visit Plan Quadriceps strength, assess range 1X/week, balance and functional progressions    PT Home Exercise Plan Access Code: [email or web address]    Consulted and Agree with Plan of Care Patient             Patient will benefit from skilled therapeutic intervention in order to improve the following deficits and impairments:  Abnormal gait, Decreased activity tolerance, Decreased balance, Decreased endurance, Decreased mobility, Decreased range of motion, Decreased skin integrity, Decreased scar mobility, Decreased strength, Increased edema, Obesity, Pain, Postural dysfunction  Visit Diagnosis: Muscle weakness (generalized)  Stiffness of right knee, not elsewhere classified  Acute pain of right knee  Localized edema  Other abnormalities of gait and mobility     Problem List Patient Active Problem List   Diagnosis Date Noted   Status post total knee replacement, right 05/08/2021   Primary osteoarthritis of right knee 05/09/2020    Cherlyn Cushingob W Tammye Kahler, PT, MPT 06/09/2021, 3:42 PM  [facility] [address] [place], [place], [address] Phone: [phone]   Fax:  [phone]  Name: Eugene Hardin MRN: [id number] Date of Birth: 11/28/1960

## 2021-06-14 ENCOUNTER — Encounter: Payer: 59 | Admitting: Rehabilitative and Restorative Service Providers"

## 2021-06-16 ENCOUNTER — Encounter: Payer: Self-pay | Admitting: Rehabilitative and Restorative Service Providers"

## 2021-06-16 ENCOUNTER — Other Ambulatory Visit: Payer: Self-pay

## 2021-06-16 ENCOUNTER — Ambulatory Visit (INDEPENDENT_AMBULATORY_CARE_PROVIDER_SITE_OTHER): Payer: Managed Care, Other (non HMO) | Admitting: Rehabilitative and Restorative Service Providers"

## 2021-06-16 DIAGNOSIS — R2689 Other abnormalities of gait and mobility: Secondary | ICD-10-CM | POA: Diagnosis not present

## 2021-06-16 DIAGNOSIS — M25661 Stiffness of right knee, not elsewhere classified: Secondary | ICD-10-CM

## 2021-06-16 DIAGNOSIS — R600 Localized edema: Secondary | ICD-10-CM

## 2021-06-16 DIAGNOSIS — R2681 Unsteadiness on feet: Secondary | ICD-10-CM | POA: Diagnosis not present

## 2021-06-16 DIAGNOSIS — M6281 Muscle weakness (generalized): Secondary | ICD-10-CM

## 2021-06-16 DIAGNOSIS — M25561 Pain in right knee: Secondary | ICD-10-CM | POA: Diagnosis not present

## 2021-06-16 NOTE — Therapy (Signed)
[facility] Physical Therapy [address] [place], [place], [address] Phone: [phone]   Fax:  [phone]  Physical Therapy Treatment  Patient Details  Name: Eugene Hardin MRN: [id number] Date of Birth: [birth year] Referring Provider (PT): Cristie Hem, [place]   Encounter Date: 06/16/2021   PT End of Session - 06/16/21 1303     Visit Number 4    Number of Visits 16    Date for PT Re-Evaluation 07/21/21    Authorization Type [employer]    PT Start Time 1258    PT Stop Time 1348    PT Time Calculation (min) 50 min    Activity Tolerance Patient tolerated treatment well    Behavior During Therapy WFL for tasks assessed/performed             Past Medical History:  Diagnosis Date   Anxiety    Arthritis    Back pain    Chronic pain    Depression    Fibromyalgia    GERD (gastroesophageal reflux disease)    Hyperlipidemia    Hypertension    Neuropathy    Pneumonia     Past Surgical History:  Procedure Laterality Date   CARPAL TUNNEL RELEASE Left    MANDIBLE RECONSTRUCTION     SHOULDER SURGERY Left    TOTAL KNEE ARTHROPLASTY Right 05/08/2021   Procedure: RIGHT TOTAL KNEE ARTHROPLASTY;  Surgeon: Tarry Kos, MD;  Location: [facility];  Service: Orthopedics;  Laterality: Right;   WOUND EXPLORATION Right 01/11/2016   Procedure: RIGHT WRIST EXPLORATION, RIGHT WRIST NERVE, TENDON AND ARTERY  REPAIR;  Surgeon: Dairl Ponder, MD;  Location: [facility];  Service: Orthopedics;  Laterality: Right;    There were no vitals filed for this visit.   Subjective Assessment - 06/16/21 1302     Subjective Pt. stated pain about 2/10 upon arrival today.  Pt. indicated he felt like he just needs to get stronger.  Has noticed some popping under knee cap with walking at times.    Pertinent History OA, anxiety, back pain, fibromyalgia, HTN, neuropathy,    Limitations Walking;Lifting;House hold activities;Standing    Patient Stated Goals He get back to  normal life including moving in community without pain.    Currently in Pain? Yes    Pain Score 2     Pain Location Knee    Pain Orientation Right    Pain Descriptors / Indicators Aching;Tightness    Pain Type Surgical pain    Pain Onset More than a month ago    Pain Frequency Intermittent    Aggravating Factors  walking created popping sometimes    Pain Relieving Factors taking pain medicine, ice                [facility] PT Assessment - 06/16/21 0001       Assessment   Medical Diagnosis Z96.651 (ICD-10-CM) - Hx of total knee replacement, right    Referring Provider (PT) Cristie Hem, PA    Onset Date/Surgical Date 05/08/21    Hand Dominance Right      AROM   Right Knee Extension 0   in LAQ   Right Knee Flexion 129   supine heel slide measurement                          [facility] Adult PT Treatment/Exercise - 06/16/21 0001       Ambulation/Gait   Ambulation/Gait Assistance 7: Independent  Neuro Re-ed    Neuro Re-ed Details  tandem stance on foam 1 min x 2 bilateral, retro step on Rt leg x 20      Knee/Hip Exercises: Stretches   Gastroc Stretch 30 seconds;3 reps;Both   incline board     Knee/Hip Exercises: Aerobic   Recumbent Bike Seat 2, lvl 7 (self selected) 6 mins      Knee/Hip Exercises: Machines for Strengthening   Cybex Knee Extension Full range (no knee pain noted) double leg extension, Rt leg eccentric lowering 2 x 10 10 lbs    Total Gym Leg Press double leg 131 lbs 2 x 15, Rt leg 75 lbs 2 x 10      Knee/Hip Exercises: Standing   Forward Step Up Step Height: 6";2 sets;10 reps;Right      Knee/Hip Exercises: Supine   Heel Slides AROM;Right   x 10 (2-3 second holds)     Vasopneumatic   Number Minutes Vasopneumatic  10 minutes    Vasopnuematic Location  Knee    Vasopneumatic Pressure Medium    Vasopneumatic Temperature  34                       PT Short Term Goals - 06/16/21 1315       PT SHORT TERM GOAL #1   Title  Right knee PROM -3* extension to 105* flexion    Time 4    Period Weeks    Status Achieved    Target Date 06/23/21      PT SHORT TERM GOAL #2   Title Right knee pain </= 4/10 with standing & gait    Time 4    Period Weeks    Status Achieved    Target Date 06/23/21      PT SHORT TERM GOAL #3   Title Patient demonstrates & verbalized understanding of initial HEP    Time 4    Period Weeks    Status Achieved    Target Date 06/23/21               PT Long Term Goals - 05/31/21 1349       PT LONG TERM GOAL #1   Title FOTO score >/= 64%    Time 8    Period Weeks    Status New    Target Date 07/21/21      PT LONG TERM GOAL #2   Title right Knee pain </= 2/10 with standing & gait activities    Time 8    Period Weeks    Status New    Target Date 07/21/21      PT LONG TERM GOAL #3   Title Right knee AROM antigravity extension -2* and flexion 95*    Time 8    Period Weeks    Status New    Target Date 07/21/21      PT LONG TERM GOAL #4   Title Right Knee PROM 0* extension to 110* flexion    Time 8    Period Weeks    Status New    Target Date 07/21/21      PT LONG TERM GOAL #5   Title Patient reports ambulating in community including ramps, curbs & stairs without device independently.    Time 8    Period Weeks    Status New    Target Date 07/21/21  Plan - 06/16/21 1315     Clinical Impression Statement Assessment of AROM Rt knee was good with reaching esblished goals for motion already.  Pt. to benefit from continued strengthening/movement coordination intervention to facilitate continued improvement in functional activity.    Personal Factors and Comorbidities Comorbidity 3+;Fitness    Comorbidities OA, anxiety, back pain, fibromyalgia, HTN, neuropathy,    Examination-Activity Limitations Lift;Locomotion Level;Sleep;Squat;Stairs;Stand    Examination-Participation Restrictions Community Activity    Stability/Clinical Decision Making  Stable/Uncomplicated    Rehab Potential Good    PT Frequency 2x / week    PT Duration 8 weeks    PT Treatment/Interventions ADLs/Self Care Home Management;DME Instruction;Gait training;Stair training;Functional mobility training;Therapeutic activities;Therapeutic exercise;Balance training;Neuromuscular re-education;Patient/family education;Manual techniques;Electrical Stimulation;Moist Heat;Cryotherapy;Scar mobilization;Passive range of motion;Vasopneumatic Device;Joint Manipulations    PT Next Visit Plan Quad strengthening, WB functional strength    PT Home Exercise Plan Access Code: [email or web address]    Consulted and Agree with Plan of Care Patient             Patient will benefit from skilled therapeutic intervention in order to improve the following deficits and impairments:  Abnormal gait, Decreased activity tolerance, Decreased balance, Decreased endurance, Decreased mobility, Decreased range of motion, Decreased skin integrity, Decreased scar mobility, Decreased strength, Increased edema, Obesity, Pain, Postural dysfunction  Visit Diagnosis: Acute pain of right knee  Localized edema  Other abnormalities of gait and mobility  Unsteadiness on feet  Muscle weakness (generalized)  Stiffness of right knee, not elsewhere classified     Problem List Patient Active Problem List   Diagnosis Date Noted   Status post total knee replacement, right 05/08/2021   Primary osteoarthritis of right knee 05/09/2020    Chyrel Masson, PT, DPT, OCS, ATC 06/16/21  1:35 PM   [facility] Physical Therapy [address] [place], [place], [address] Phone: [phone]   Fax:  [phone]  Name: Eugene [place]in MRN: [id number] Date of Birth: 09-25-1960

## 2021-06-20 ENCOUNTER — Encounter: Payer: Self-pay | Admitting: Orthopaedic Surgery

## 2021-06-20 ENCOUNTER — Other Ambulatory Visit: Payer: Self-pay

## 2021-06-20 ENCOUNTER — Ambulatory Visit (INDEPENDENT_AMBULATORY_CARE_PROVIDER_SITE_OTHER): Payer: 59 | Admitting: Orthopaedic Surgery

## 2021-06-20 ENCOUNTER — Ambulatory Visit (INDEPENDENT_AMBULATORY_CARE_PROVIDER_SITE_OTHER): Payer: Managed Care, Other (non HMO)

## 2021-06-20 DIAGNOSIS — Z96651 Presence of right artificial knee joint: Secondary | ICD-10-CM

## 2021-06-20 NOTE — Progress Notes (Signed)
° °  Post-Op Visit Note   Patient: Eugene [place]in           Date of Birth: 06-25-1960           MRN: [id number] Visit Date: 06/20/2021 PCP: Blair Heys, MD   Assessment & Plan:  Chief Complaint:  Chief Complaint  Patient presents with   Right Knee - Pain, Follow-up   Visit Diagnoses:  1. Hx of total knee replacement, right     Plan: Eugene Hardin is 6 weeks status post right total knee replacement press-fit.  He is doing well overall.  He has been doing physical therapy at our office twice a week.  He is getting 129 degrees of flexion.  He has no complaints.  Examination of the right knee shows a healed surgical scar.  He has some mild swelling.  No signs of infection.  Eugene Hardin is doing really well from the surgery and he is really progressing well from the standpoint of range of motion.  He will continue to do physical therapy.  Dental prophylaxis reinforced.  Recheck in 6 weeks.  Follow-Up Instructions: Return in about 6 weeks (around 08/01/2021).   Orders:  Orders Placed This Encounter  Procedures   XR Knee 1-2 Views Right   No orders of the defined types were placed in this encounter.   Imaging: No results found.  PMFS History: Patient Active Problem List   Diagnosis Date Noted   Status post total knee replacement, right 05/08/2021   Primary osteoarthritis of right knee 05/09/2020   Past Medical History:  Diagnosis Date   Anxiety    Arthritis    Back pain    Chronic pain    Depression    Fibromyalgia    GERD (gastroesophageal reflux disease)    Hyperlipidemia    Hypertension    Neuropathy    Pneumonia     Family History  Problem Relation Age of Onset   Brain cancer Mother    Diabetes Father     Past Surgical History:  Procedure Laterality Date   CARPAL TUNNEL RELEASE Left    MANDIBLE RECONSTRUCTION     SHOULDER SURGERY Left    TOTAL KNEE ARTHROPLASTY Right 05/08/2021   Procedure: RIGHT TOTAL KNEE ARTHROPLASTY;  Surgeon: Tarry Kos, MD;  [facility];  Service: Orthopedics;  Laterality: Right;   WOUND EXPLORATION Right 01/11/2016   Procedure: RIGHT WRIST EXPLORATION, RIGHT WRIST NERVE, TENDON AND ARTERY  REPAIR;  Surgeon: Dairl Ponder, MD;  [facility];  Service: Orthopedics;  Laterality: Right;   Social History   Occupational History   Not on file  Tobacco Use   Smoking status: Never   Smokeless tobacco: Never  Vaping Use   Vaping Use: Never used  Substance and Sexual Activity   Alcohol use: Yes    Comment: social   Drug use: No   Sexual activity: Not on file

## 2021-06-21 ENCOUNTER — Ambulatory Visit (INDEPENDENT_AMBULATORY_CARE_PROVIDER_SITE_OTHER): Payer: Managed Care, Other (non HMO) | Admitting: Rehabilitative and Restorative Service Providers"

## 2021-06-21 ENCOUNTER — Encounter: Payer: Self-pay | Admitting: Rehabilitative and Restorative Service Providers"

## 2021-06-21 DIAGNOSIS — R600 Localized edema: Secondary | ICD-10-CM | POA: Diagnosis not present

## 2021-06-21 DIAGNOSIS — M25661 Stiffness of right knee, not elsewhere classified: Secondary | ICD-10-CM

## 2021-06-21 DIAGNOSIS — R2689 Other abnormalities of gait and mobility: Secondary | ICD-10-CM | POA: Diagnosis not present

## 2021-06-21 DIAGNOSIS — M25561 Pain in right knee: Secondary | ICD-10-CM

## 2021-06-21 DIAGNOSIS — M6281 Muscle weakness (generalized): Secondary | ICD-10-CM | POA: Diagnosis not present

## 2021-06-21 NOTE — Patient Instructions (Signed)
Access Code: [email or web address] URL: [email or web address] Date: 06/21/2021 Prepared by: Pauletta Browns  Exercises Quad Setting and Stretching - 2-4 x daily - 7 x weekly - 5-10 sets - 10 reps - prop 5-10 minutes & quad set5 seconds hold Supine Leg Press - 2-4 x daily - 7 x weekly - 5-10 sets - 10 reps - 5 seconds hold Supine Heel Slide with Strap - 2-3 x daily - 7 x weekly - 2-3 sets - 10 reps - 5 seconds hold Seated Straight Leg Raises - 2-3 x daily - 7 x weekly - 2-3 sets - 10 reps - 5 seconds hold Seated Knee Flexion Extension AROM - 2-4 x daily - 7 x weekly - 2-3 sets - 10 reps - 5 seconds hold Seated Hamstring Stretch with Strap - 2-4 x daily - 7 x weekly - 1 sets - 3 reps - 20-30 seconds hold

## 2021-06-21 NOTE — Therapy (Signed)
[facility] Physical Therapy [address] [place], [place], [address] Phone: [phone]   Fax:  [phone]  Physical Therapy Treatment  Patient Details  Name: Eugene Hardin MRN: [id number] Date of Birth: [birth year] Referring Provider (PT): Cristie Hem, [place]   Encounter Date: 06/21/2021   PT End of Session - 06/21/21 1619     Visit Number 5    Number of Visits 16    Date for PT Re-Evaluation 07/21/21    Authorization Type [employer]    PT Start Time 1518    PT Stop Time 1606    PT Time Calculation (min) 48 min    Activity Tolerance Patient tolerated treatment well    Behavior During Therapy WFL for tasks assessed/performed             Past Medical History:  Diagnosis Date   Anxiety    Arthritis    Back pain    Chronic pain    Depression    Fibromyalgia    GERD (gastroesophageal reflux disease)    Hyperlipidemia    Hypertension    Neuropathy    Pneumonia     Past Surgical History:  Procedure Laterality Date   CARPAL TUNNEL RELEASE Left    MANDIBLE RECONSTRUCTION     SHOULDER SURGERY Left    TOTAL KNEE ARTHROPLASTY Right 05/08/2021   Procedure: RIGHT TOTAL KNEE ARTHROPLASTY;  Surgeon: Tarry Kos, MD;  Location: [facility] OR;  Service: Orthopedics;  Laterality: Right;   WOUND EXPLORATION Right 01/11/2016   Procedure: RIGHT WRIST EXPLORATION, RIGHT WRIST NERVE, TENDON AND ARTERY  REPAIR;  Surgeon: Dairl Ponder, MD;  Location: [facility];  Service: Orthopedics;  Laterality: Right;    There were no vitals filed for this visit.   Subjective Assessment - 06/21/21 1525     Subjective Eugene Hardin reports 2-3/10 pain today.  HEP compliance has been consistent.    Pertinent History OA, anxiety, back pain, fibromyalgia, HTN, neuropathy,    Limitations Walking;Lifting;House hold activities;Standing    How long can you sit comfortably? 30 minutes or more    How long can you stand comfortably? 1 hour    How long can you walk comfortably?  Ok with around the house and with errands    Patient Stated Goals He get back to normal life including moving in community without pain.    Currently in Pain? Yes    Pain Score 3     Pain Location Knee    Pain Orientation Right    Pain Descriptors / Indicators Aching;Tightness;Sore    Pain Type Surgical pain    Pain Radiating Towards NA    Pain Onset More than a month ago    Pain Frequency Intermittent    Aggravating Factors  Too much activity results in increased edema    Pain Relieving Factors Pain meds and ice    Effect of Pain on Daily Activities Limits WB endurance, out of work    Multiple Pain Sites Yes    Pain Score 5    Pain Location Back    Pain Descriptors / Indicators Aching    Pain Type Chronic pain    Pain Onset More than a month ago    Pain Frequency Constant    Aggravating Factors  Prolonged postures (sitting)    Pain Relieving Factors Pain meds    Effect of Pain on Daily Activities Uncomfortable with prolonged postures and sitting  [facility] Adult PT Treatment/Exercise - 06/21/21 0001       Therapeutic Activites    Therapeutic Activities ADL's    ADL's Step-up and over 6" and 8" step focus on slow eccentrics (no hands), leg press slow eccentrics for sit to stand, curbs, stairs      Neuro Re-ed    Neuro Re-ed Details  Tandem balance floor eyes open and eyes closed 4X 30 seconds each.  Single leg balance eyes open and closed 2X 10 seconds each.      Exercises   Exercises Knee/Hip;Other Exercises      Knee/Hip Exercises: Aerobic   Recumbent Bike Seat 2, Level 7-8 for 5 minutes      Knee/Hip Exercises: Machines for Strengthening   Total Gym Leg Press 131# double leg push into extension, slow eccentrics & 75# R leg only 15X each 2 sets      Modalities   Modalities Vasopneumatic      Vasopneumatic   Number Minutes Vasopneumatic  10 minutes    Vasopnuematic Location  Knee    Vasopneumatic Pressure Medium     Vasopneumatic Temperature  34                     PT Education - 06/21/21 1616     Education Details Reviewed HEP, emphasis on slow eccentrics with quadriceps strength and balance activities.    Person(s) Educated Patient    Methods Explanation;Demonstration;Verbal cues    Comprehension Returned demonstration;Verbal cues required;Verbalized understanding;Need further instruction              PT Short Term Goals - 06/16/21 1315       PT SHORT TERM GOAL #1   Title Right knee PROM -3* extension to 105* flexion    Time 4    Period Weeks    Status Achieved    Target Date 06/23/21      PT SHORT TERM GOAL #2   Title Right knee pain </= 4/10 with standing & gait    Time 4    Period Weeks    Status Achieved    Target Date 06/23/21      PT SHORT TERM GOAL #3   Title Patient demonstrates & verbalized understanding of initial HEP    Time 4    Period Weeks    Status Achieved    Target Date 06/23/21               PT Long Term Goals - 05/31/21 1349       PT LONG TERM GOAL #1   Title FOTO score >/= 64%    Time 8    Period Weeks    Status New    Target Date 07/21/21      PT LONG TERM GOAL #2   Title right Knee pain </= 2/10 with standing & gait activities    Time 8    Period Weeks    Status New    Target Date 07/21/21      PT LONG TERM GOAL #3   Title Right knee AROM antigravity extension -2* and flexion 95*    Time 8    Period Weeks    Status New    Target Date 07/21/21      PT LONG TERM GOAL #4   Title Right Knee PROM 0* extension to 110* flexion    Time 8    Period Weeks    Status New    Target Date 07/21/21  PT LONG TERM GOAL #5   Title Patient reports ambulating in community including ramps, curbs & stairs without device independently.    Time 8    Period Weeks    Status New    Target Date 07/21/21                   Plan - 06/21/21 1617     Clinical Impression Statement Eugene Hardin is happy with his PT results thus far.   Quadriceps strength and edema control remain high priorities along with balance.  Continue POC to meet all LTGs with FOTO next visit.    Personal Factors and Comorbidities Comorbidity 3+;Fitness    Comorbidities OA, anxiety, back pain, fibromyalgia, HTN, neuropathy,    Examination-Activity Limitations Lift;Locomotion Level;Sleep;Squat;Stairs;Stand    Examination-Participation Restrictions Community Activity    Stability/Clinical Decision Making Stable/Uncomplicated    Rehab Potential Good    PT Frequency 2x / week    PT Duration 8 weeks    PT Treatment/Interventions ADLs/Self Care Home Management;DME Instruction;Gait training;Stair training;Functional mobility training;Therapeutic activities;Therapeutic exercise;Balance training;Neuromuscular re-education;Patient/family education;Manual techniques;Electrical Stimulation;Moist Heat;Cryotherapy;Scar mobilization;Passive range of motion;Vasopneumatic Device;Joint Manipulations    PT Next Visit Plan Quad strengthening, WB functional strength, balance, edema control    PT Home Exercise Plan Access Code: [email or web address]    Consulted and Agree with Plan of Care Patient             Patient will benefit from skilled therapeutic intervention in order to improve the following deficits and impairments:  Abnormal gait, Decreased activity tolerance, Decreased balance, Decreased endurance, Decreased mobility, Decreased range of motion, Decreased skin integrity, Decreased scar mobility, Decreased strength, Increased edema, Obesity, Pain, Postural dysfunction  Visit Diagnosis: Other abnormalities of gait and mobility  Muscle weakness (generalized)  Localized edema  Stiffness of right knee, not elsewhere classified  Acute pain of right knee     Problem List Patient Active Problem List   Diagnosis Date Noted   Status post total knee replacement, right 05/08/2021   Primary osteoarthritis of right knee 05/09/2020    Cherlyn Cushing, PT, MPT 06/21/2021,  4:26 PM  [facility] Physical Therapy [address] [place], [place], [address] Phone: [phone]   Fax:  [phone]  Name: Eugene Hardin MRN: [id number] Date of Birth: 17-Sep-1960

## 2021-06-23 ENCOUNTER — Ambulatory Visit (INDEPENDENT_AMBULATORY_CARE_PROVIDER_SITE_OTHER): Payer: Managed Care, Other (non HMO) | Admitting: Rehabilitative and Restorative Service Providers"

## 2021-06-23 ENCOUNTER — Other Ambulatory Visit: Payer: Self-pay

## 2021-06-23 ENCOUNTER — Encounter: Payer: Self-pay | Admitting: Rehabilitative and Restorative Service Providers"

## 2021-06-23 DIAGNOSIS — M6281 Muscle weakness (generalized): Secondary | ICD-10-CM

## 2021-06-23 DIAGNOSIS — R600 Localized edema: Secondary | ICD-10-CM | POA: Diagnosis not present

## 2021-06-23 DIAGNOSIS — R2689 Other abnormalities of gait and mobility: Secondary | ICD-10-CM

## 2021-06-23 DIAGNOSIS — M25661 Stiffness of right knee, not elsewhere classified: Secondary | ICD-10-CM

## 2021-06-23 NOTE — Therapy (Signed)
[facility] Physical Therapy [address] [place], [place], [address] Phone: [phone]   Fax:  [phone]  Physical Therapy Treatment  Patient Details  Name: Eugene Hardin MRN: [id number] Date of Birth: 1960/10/29 Referring Provider (PT): Cristie Hem, [place]   Encounter Date: 06/23/2021   PT End of Session - 06/23/21 1627     Visit Number 6    Number of Visits 16    Date for PT Re-Evaluation 07/21/21    Authorization Type [employer]    PT Start Time 1430    PT Stop Time 1525    PT Time Calculation (min) 55 min    Activity Tolerance Patient tolerated treatment well    Behavior During Therapy WFL for tasks assessed/performed             Past Medical History:  Diagnosis Date   Anxiety    Arthritis    Back pain    Chronic pain    Depression    Fibromyalgia    GERD (gastroesophageal reflux disease)    Hyperlipidemia    Hypertension    Neuropathy    Pneumonia     Past Surgical History:  Procedure Laterality Date   CARPAL TUNNEL RELEASE Left    MANDIBLE RECONSTRUCTION     SHOULDER SURGERY Left    TOTAL KNEE ARTHROPLASTY Right 05/08/2021   Procedure: RIGHT TOTAL KNEE ARTHROPLASTY;  Surgeon: Tarry Kos, MD;  Location: [facility];  Service: Orthopedics;  Laterality: Right;   WOUND EXPLORATION Right 01/11/2016   Procedure: RIGHT WRIST EXPLORATION, RIGHT WRIST NERVE, TENDON AND ARTERY  REPAIR;  Surgeon: Dairl Ponder, MD;  Location: [facility];  Service: Orthopedics;  Laterality: Right;    There were no vitals filed for this visit.   Subjective Assessment - 06/23/21 1435     Subjective Eugene Hardin reports he is still taking the hydrocodone PRN.  He reports he was on his feet a lot yesterday and didn't sleep well due to knee pain.    Pertinent History OA, anxiety, back pain, fibromyalgia, HTN, neuropathy,    Limitations Walking;Lifting;House hold activities;Standing    How long can you sit comfortably? 30 minutes or more    How long  can you stand comfortably? 1 hour    How long can you walk comfortably? Ok with around the house and with errands    Patient Stated Goals He get back to normal life including moving in community without pain.    Currently in Pain? Yes    Pain Score 3     Pain Location Knee    Pain Orientation Right    Pain Descriptors / Indicators Aching;Tightness;Sore    Pain Type Surgical pain    Pain Radiating Towards NA    Pain Onset More than a month ago    Pain Frequency Intermittent    Aggravating Factors  Prolonged WB or postures    Pain Relieving Factors Exercises, rest, ice, meds PRN    Effect of Pain on Daily Activities Out of work due to increased edema with prolonged WB (required for job)    Pain Onset More than a month ago                               [facility] Adult PT Treatment/Exercise - 06/23/21 0001       Therapeutic Activites    Therapeutic Activities ADL's    ADL's Step-up and over 6" step focus on slow  eccentrics and step-down off 4" step (no hands), ball squats with bounce & leg press slow eccentrics for sit to stand, curbs, stairs      Neuro Re-ed    Neuro Re-ed Details  Single leg stance 2X 20 seconds      Exercises   Exercises Knee/Hip;Other Exercises      Knee/Hip Exercises: Aerobic   Recumbent Bike Seat 2, Level 7-8 for 5 minutes      Knee/Hip Exercises: Machines for Strengthening   Cybex Knee Extension 90-40 degrees 25# 3 sets of 5 emphasis on slow eccentrics (double leg up, R leg only down)    Total Gym Leg Press 137# double leg and 81# R leg only 15X each push into full extension and stretch into flexion      Knee/Hip Exercises: Standing   Other Standing Knee Exercises Ball squats 2 sets of 5 with 5 bounce at the bottom      Modalities   Modalities Vasopneumatic      Vasopneumatic   Number Minutes Vasopneumatic  10 minutes    Vasopnuematic Location  Knee    Vasopneumatic Pressure Medium    Vasopneumatic Temperature  34                      PT Education - 06/23/21 1633     Education Details Reviewed HEP and discussed activities to advance for quadriceps strength (ball squats with bounce, single leg balance, step-downs).    Person(s) Educated Patient    Methods Explanation;Demonstration;Tactile cues;Verbal cues    Comprehension Verbalized understanding;Returned demonstration;Verbal cues required;Tactile cues required;Need further instruction              PT Short Term Goals - 06/16/21 1315       PT SHORT TERM GOAL #1   Title Right knee PROM -3* extension to 105* flexion    Time 4    Period Weeks    Status Achieved    Target Date 06/23/21      PT SHORT TERM GOAL #2   Title Right knee pain </= 4/10 with standing & gait    Time 4    Period Weeks    Status Achieved    Target Date 06/23/21      PT SHORT TERM GOAL #3   Title Patient demonstrates & verbalized understanding of initial HEP    Time 4    Period Weeks    Status Achieved    Target Date 06/23/21               PT Long Term Goals - 05/31/21 1349       PT LONG TERM GOAL #1   Title FOTO score >/= 64%    Time 8    Period Weeks    Status New    Target Date 07/21/21      PT LONG TERM GOAL #2   Title right Knee pain </= 2/10 with standing & gait activities    Time 8    Period Weeks    Status New    Target Date 07/21/21      PT LONG TERM GOAL #3   Title Right knee AROM antigravity extension -2* and flexion 95*    Time 8    Period Weeks    Status New    Target Date 07/21/21      PT LONG TERM GOAL #4   Title Right Knee PROM 0* extension to 110* flexion    Time 8  Period Weeks    Status New    Target Date 07/21/21      PT LONG TERM GOAL #5   Title Patient reports ambulating in community including ramps, curbs & stairs without device independently.    Time 8    Period Weeks    Status New    Target Date 07/21/21                   Plan - 06/23/21 1633     Clinical Impression Statement Eugene Hardin  continues to do a great job with his post-surgical PT.  Improving quadriceps strength to reduce edema and improve WB function is the priority of continued work.    Personal Factors and Comorbidities Comorbidity 3+;Fitness    Comorbidities OA, anxiety, back pain, fibromyalgia, HTN, neuropathy,    Examination-Activity Limitations Lift;Locomotion Level;Sleep;Squat;Stairs;Stand    Examination-Participation Restrictions Community Activity    Stability/Clinical Decision Making Stable/Uncomplicated    Rehab Potential Good    PT Frequency 2x / week    PT Duration 8 weeks    PT Treatment/Interventions ADLs/Self Care Home Management;DME Instruction;Gait training;Stair training;Functional mobility training;Therapeutic activities;Therapeutic exercise;Balance training;Neuromuscular re-education;Patient/family education;Manual techniques;Electrical Stimulation;Moist Heat;Cryotherapy;Scar mobilization;Passive range of motion;Vasopneumatic Device;Joint Manipulations    PT Next Visit Plan Quad strengthening, WB functional strength, balance, edema control, FOTO    PT Home Exercise Plan Access Code: [email or web address]    Consulted and Agree with Plan of Care Patient             Patient will benefit from skilled therapeutic intervention in order to improve the following deficits and impairments:  Abnormal gait, Decreased activity tolerance, Decreased balance, Decreased endurance, Decreased mobility, Decreased range of motion, Decreased skin integrity, Decreased scar mobility, Decreased strength, Increased edema, Obesity, Pain, Postural dysfunction  Visit Diagnosis: Other abnormalities of gait and mobility  Muscle weakness (generalized)  Localized edema  Stiffness of right knee, not elsewhere classified     Problem List Patient Active Problem List   Diagnosis Date Noted   Status post total knee replacement, right 05/08/2021   Primary osteoarthritis of right knee 05/09/2020    Cherlyn Cushing, PT,  MPT 06/23/2021, 4:35 PM  [facility] Physical Therapy [address] [place], [place], [address] Phone: [phone]   Fax:  [phone]  Name: Eugene Hardin MRN: [id number] Date of Birth: 09-Mar-1961

## 2021-06-23 NOTE — Patient Instructions (Signed)
Access Code: [email or web address] URL: [email or web address] Date: 06/23/2021 Prepared by: Vista Mink  Exercises Quad Setting and Stretching - 2-4 x daily - 7 x weekly - 5-10 sets - 10 reps - prop 5-10 minutes & quad set5 seconds hold Supine Leg Press - 2-4 x daily - 7 x weekly - 5-10 sets - 10 reps - 5 seconds hold Ankle Alphabet in Elevation - 2-4 x daily - 7 x weekly - 1 sets - 1 reps Supine Heel Slide with Strap - 2-3 x daily - 7 x weekly - 2-3 sets - 10 reps - 5 seconds hold Supine Straight Leg Raises - 2-3 x daily - 7 x weekly - 2-3 sets - 10 reps - 5 seconds hold Seated Knee Flexion Extension AROM - 2-4 x daily - 7 x weekly - 2-3 sets - 10 reps - 5 seconds hold Seated Hamstring Stretch with Strap - 2-4 x daily - 7 x weekly - 1 sets - 3 reps - 20-30 seconds hold  *Ball Squats with bounce *Step-down *Single leg balance

## 2021-06-28 ENCOUNTER — Encounter: Payer: 59 | Admitting: Rehabilitative and Restorative Service Providers"

## 2021-06-30 ENCOUNTER — Other Ambulatory Visit: Payer: Self-pay

## 2021-06-30 ENCOUNTER — Ambulatory Visit (INDEPENDENT_AMBULATORY_CARE_PROVIDER_SITE_OTHER): Payer: Managed Care, Other (non HMO) | Admitting: Rehabilitative and Restorative Service Providers"

## 2021-06-30 ENCOUNTER — Encounter: Payer: Self-pay | Admitting: Rehabilitative and Restorative Service Providers"

## 2021-06-30 DIAGNOSIS — M25661 Stiffness of right knee, not elsewhere classified: Secondary | ICD-10-CM

## 2021-06-30 DIAGNOSIS — R600 Localized edema: Secondary | ICD-10-CM

## 2021-06-30 DIAGNOSIS — R2689 Other abnormalities of gait and mobility: Secondary | ICD-10-CM

## 2021-06-30 DIAGNOSIS — M6281 Muscle weakness (generalized): Secondary | ICD-10-CM | POA: Diagnosis not present

## 2021-06-30 NOTE — Patient Instructions (Signed)
Access Code: [email or web address] URL: [email or web address] Date: 06/30/2021 Prepared by: Vista Mink  Exercises Quad Setting and Stretching - 2-4 x daily - 7 x weekly - 5-10 sets - 10 reps - prop 5-10 minutes & quad set5 seconds hold Supine Leg Press - 2-4 x daily - 7 x weekly - 5-10 sets - 10 reps - 5 seconds hold Ankle Alphabet in Elevation - 2-4 x daily - 7 x weekly - 1 sets - 1 reps Supine Heel Slide with Strap - 2-3 x daily - 7 x weekly - 2-3 sets - 10 reps - 5 seconds hold Supine Straight Leg Raises - 2-3 x daily - 7 x weekly - 2-3 sets - 10 reps - 5 seconds hold Seated Knee Flexion Extension AROM - 2-4 x daily - 7 x weekly - 2-3 sets - 10 reps - 5 seconds hold Seated Hamstring Stretch with Strap - 2-4 x daily - 7 x weekly - 1 sets - 3 reps - 20-30 seconds hold Supine Quadricep Sets - 1 x daily - 3 x weekly - 2-3 sets - 10 reps - 5 second hold

## 2021-06-30 NOTE — Therapy (Signed)
[facility] [address] [place], [place], [address] Phone: [phone]   Fax:  [phone]  Physical Therapy Treatment  Patient Details  Name: Eugene Hardin MRN: [id number] Date of Birth: [birth year] Referring Provider (PT): Aundra Dubin, PA  PHYSICAL THERAPY DISCHARGE SUMMARY  Visits from Start of Care: 7  Current functional level related to goals / functional outcomes: See note   Remaining deficits: None, see note   Education / Equipment: Long-term HEP   Patient agrees to discharge. Patient goals were met. Patient is being discharged due to meeting the stated rehab goals.   Encounter Date: 06/30/2021   PT End of Session - 06/30/21 1331     Visit Number 7    Number of Visits 16    Date for PT Re-Evaluation 07/21/21    Authorization Type [employer]    PT Start Time 1302    PT Stop Time 1352    PT Time Calculation (min) 50 min    Activity Tolerance Patient tolerated treatment well;No increased pain    Behavior During Therapy WFL for tasks assessed/performed             Past Medical History:  Diagnosis Date   Anxiety    Arthritis    Back pain    Chronic pain    Depression    Fibromyalgia    GERD (gastroesophageal reflux disease)    Hyperlipidemia    Hypertension    Neuropathy    Pneumonia     Past Surgical History:  Procedure Laterality Date   CARPAL TUNNEL RELEASE Left    MANDIBLE RECONSTRUCTION     SHOULDER SURGERY Left    TOTAL KNEE ARTHROPLASTY Right 05/08/2021   Procedure: RIGHT TOTAL KNEE ARTHROPLASTY;  Surgeon: Leandrew Koyanagi, MD;  Location: [place];  Service: Orthopedics;  Laterality: Right;   WOUND EXPLORATION Right 01/11/2016   Procedure: RIGHT WRIST EXPLORATION, RIGHT WRIST NERVE, TENDON AND ARTERY  REPAIR;  Surgeon: Charlotte Crumb, MD;  Location: [place];  Service: Orthopedics;  Laterality: Right;    There were no vitals filed for this visit.   Subjective Assessment - 06/30/21  1332     Subjective [name] reports he is still taking the hydrocodone PRN, but for his back and hands, not knee.  He reports doing more around the house without being limited by his R knee.    Pertinent History OA, anxiety, back pain, fibromyalgia, HTN, neuropathy,    Limitations Walking;Lifting;House hold activities;Standing    How long can you sit comfortably? 30 minutes or more    How long can you stand comfortably? 1 hour    How long can you walk comfortably? 30-60 minutes with chores    Patient Stated Goals He get back to normal life including moving in community without pain.    Currently in Pain? Yes    Pain Score 2     Pain Location Knee    Pain Orientation Right    Pain Descriptors / Indicators Aching    Pain Type Surgical pain    Pain Radiating Towards NA    Pain Onset More than a month ago    Pain Frequency Occasional    Aggravating Factors  Prolonged WB or postures    Pain Relieving Factors Exercises, change of position, ice, meds PRN    Effect of Pain on Daily Activities Still out of work    Pain Onset More than a month ago  [facility] PT Assessment - 06/30/21 0001       AROM   Overall AROM  Within functional limits for tasks performed    AROM Assessment Site Knee    Right/Left Knee Right    Right Knee Extension 0    Right Knee Flexion 131                           [facility] Adult PT Treatment/Exercise - 06/30/21 0001       Therapeutic Activites    Therapeutic Activities ADL's    ADL's Step-up and over 8" step focus on slow eccentrics and step-down off 4" step (no hands), ball squats with bounce for sit to stand, curbs, stairs      Exercises   Exercises Knee/Hip;Other Exercises      Knee/Hip Exercises: Aerobic   Recumbent Bike Seat 2, Level 7-8 for 5 minutes      Knee/Hip Exercises: Machines for Strengthening   Cybex Knee Extension --    Total Gym Leg Press --      Knee/Hip Exercises: Standing   Other Standing Knee Exercises  Ball squats 2 sets of 5 with 5 bounce at the bottom      Knee/Hip Exercises: Supine   Quad Sets Strengthening;Both;10 reps;Limitations    Quad Sets Limitations 5 seconds, toes back, press knees down and tighten thighs      Modalities   Modalities Vasopneumatic      Vasopneumatic   Number Minutes Vasopneumatic  10 minutes    Vasopnuematic Location  Knee    Vasopneumatic Pressure Medium    Vasopneumatic Temperature  34                     PT Education - 06/30/21 1551     Education Details Reviewed recommendations for long term HEP.  Reviewed reassessment results and confirmed Eugene Hardin is comfortable with independent rehabilitation.    Person(s) Educated Patient    Methods Explanation;Tactile cues;Verbal cues;Handout    Comprehension Verbalized understanding;Tactile cues required;Returned demonstration;Verbal cues required              PT Short Term Goals - 06/16/21 1315       PT SHORT TERM GOAL #1   Title Right knee PROM -3* extension to 105* flexion    Time 4    Period Weeks    Status Achieved    Target Date 06/23/21      PT SHORT TERM GOAL #2   Title Right knee pain </= 4/10 with standing & gait    Time 4    Period Weeks    Status Achieved    Target Date 06/23/21      PT SHORT TERM GOAL #3   Title Patient demonstrates & verbalized understanding of initial HEP    Time 4    Period Weeks    Status Achieved    Target Date 06/23/21               PT Long Term Goals - 06/30/21 1552       PT LONG TERM GOAL #1   Title FOTO score >/= 64%    Baseline 73 (was 44)    Time 8    Period Weeks    Status Achieved    Target Date 07/21/21      PT LONG TERM GOAL #2   Title right Knee pain </= 2/10 with standing & gait activities    Time 8  Period Weeks    Status Achieved    Target Date 07/21/21      PT LONG TERM GOAL #3   Title Right knee AROM antigravity extension -2* and flexion 95*    Baseline 0-131 degrees    Time 8    Period Weeks     Status Achieved    Target Date 07/21/21      PT LONG TERM GOAL #4   Title Right Knee PROM 0* extension to 110* flexion    Baseline 0-131 degrees    Time 8    Period Weeks    Status Achieved    Target Date 07/21/21      PT LONG TERM GOAL #5   Title Patient reports ambulating in community including ramps, curbs & stairs without device independently.    Time 8    Period Weeks    Status Achieved    Target Date 07/21/21                   Plan - 06/30/21 1553     Clinical Impression Statement Jontae has met all LTGs.  He is independent and compliant with a great HEP which will continue to improve his strength, endurance and function.  He appears ready for independent rehabilitation.    Personal Factors and Comorbidities Comorbidity 3+;Fitness    Comorbidities OA, anxiety, back pain, fibromyalgia, HTN, neuropathy,    Examination-Activity Limitations Lift;Locomotion Level;Sleep;Squat;Stairs;Stand    Examination-Participation Restrictions Community Activity    Stability/Clinical Decision Making Stable/Uncomplicated    Rehab Potential Good    PT Frequency Other (comment)    PT Duration Other (comment)   DC   PT Treatment/Interventions ADLs/Self Care Home Management;DME Instruction;Gait training;Stair training;Functional mobility training;Therapeutic activities;Therapeutic exercise;Balance training;Neuromuscular re-education;Patient/family education;Manual techniques;Electrical Stimulation;Moist Heat;Cryotherapy;Scar mobilization;Passive range of motion;Vasopneumatic Device;Joint Manipulations    PT Next Visit Plan DC today    PT Home Exercise Plan Access Code: [email or web address]    Consulted and Agree with Plan of Care Patient             Patient will benefit from skilled therapeutic intervention in order to improve the following deficits and impairments:  Abnormal gait, Decreased activity tolerance, Decreased balance, Decreased endurance, Decreased mobility, Decreased range of motion,  Decreased skin integrity, Decreased scar mobility, Decreased strength, Increased edema, Obesity, Pain, Postural dysfunction  Visit Diagnosis: Other abnormalities of gait and mobility  Muscle weakness (generalized)  Localized edema  Stiffness of right knee, not elsewhere classified     Problem List Patient Active Problem List   Diagnosis Date Noted   Status post total knee replacement, right 05/08/2021   Primary osteoarthritis of right knee 05/09/2020    Farley Ly, PT, MPT 06/30/2021, 3:54 PM  [facility] [address] [place], [place], [address] Phone: [phone]   Fax:  [phone]  Name: ZARIAN COLPITTS MRN: [id number] Date of Birth: 01-Jul-1960

## 2021-08-01 ENCOUNTER — Ambulatory Visit: Payer: 59 | Admitting: Orthopaedic Surgery

## 2022-05-04 ENCOUNTER — Other Ambulatory Visit: Payer: Self-pay | Admitting: *Deleted

## 2022-05-04 DIAGNOSIS — K429 Umbilical hernia without obstruction or gangrene: Secondary | ICD-10-CM

## 2022-05-08 ENCOUNTER — Ambulatory Visit: Payer: Commercial Managed Care - HMO | Admitting: Surgery

## 2022-05-08 VITALS — BP 159/90 | HR 82 | Temp 98.1°F | Resp 14 | Ht 64.0 in | Wt 221.0 lb

## 2022-05-08 DIAGNOSIS — K429 Umbilical hernia without obstruction or gangrene: Secondary | ICD-10-CM

## 2022-05-08 NOTE — Progress Notes (Signed)
[facility] History and Physical  Reason for Referral:*** Referring Physician: ***    Eugene Hardin is a 61 y.o. male.  HPI: ***.  The *** started *** and has had a duration of ***.  It is associated with ***.  The *** is improved with ***, and is made worse with ***.    Quality*** Context***  Past Medical History:  Diagnosis Date  . Anxiety   . Arthritis   . Back pain   . Chronic pain   . Depression   . Fibromyalgia   . GERD (gastroesophageal reflux disease)   . Hyperlipidemia   . Hypertension   . Neuropathy   . Pneumonia     Past Surgical History:  Procedure Laterality Date  . CARPAL TUNNEL RELEASE Left   . MANDIBLE RECONSTRUCTION    . SHOULDER SURGERY Left   . TOTAL KNEE ARTHROPLASTY Right 05/08/2021   Procedure: RIGHT TOTAL KNEE ARTHROPLASTY;  Surgeon: Tarry Kos, MD;  [facility];  Service: Orthopedics;  Laterality: Right;  . WOUND EXPLORATION Right 01/11/2016   Procedure: RIGHT WRIST EXPLORATION, RIGHT WRIST NERVE, TENDON AND ARTERY  REPAIR;  Surgeon: Dairl Ponder, MD;  Location: [facility];  Service: Orthopedics;  Laterality: Right;    Family History  Problem Relation Age of Onset  . Brain cancer Mother   . Diabetes Father     Social History   Tobacco Use  . Smoking status: Never  . Smokeless tobacco: Never  Vaping Use  . Vaping Use: Never used  Substance Use Topics  . Alcohol use: Yes    Comment: social  . Drug use: No    Medications: {medication reviewed/display:3041432} Allergies as of 05/08/2022       Reactions   Tramadol Anaphylaxis   Citalopram Hydrobromide    Sleepy   Duloxetine Hcl    Sleepy   Lisinopril-hydrochlorothiazide    irritability   Sertraline Hcl    Sleepy   Venlafaxine    GI upset   Vinegar [acetic Acid]    hiccups   Septra [sulfamethoxazole-trimethoprim] Rash        Medication List        Accurate as of May 08, 2022  2:00 PM. If you have any questions,  ask your nurse or doctor.          ALPRAZolam 1 MG tablet Commonly known as: XANAX Take 1 mg by mouth at bedtime.   amLODipine 5 MG tablet Commonly known as: NORVASC Take 5 mg by mouth daily.   ascorbic acid 500 MG tablet Commonly known as: VITAMIN C Take 500 mg by mouth daily.   aspirin EC 81 MG tablet Take 1 tablet (81 mg total) by mouth 2 (two) times daily. To be taken after surgery to prevent blood clots What changed: when to take this   atorvastatin 40 MG tablet Commonly known as: LIPITOR Take 40 mg by mouth every evening.   azelastine 0.1 % nasal spray Commonly known as: ASTELIN Place 2 sprays into both nostrils 2 (two) times daily as needed for allergies.   B-complex with vitamin C tablet Take 1 tablet by mouth daily.   CLEAR EYES OP Place 1 drop into both eyes daily as needed (irritation).   FLUoxetine 10 MG capsule Commonly known as: PROZAC Take 10 mg by mouth 2 (two) times daily.   gabapentin 400 MG capsule Commonly known as: NEURONTIN Take 400 mg by mouth at bedtime.   HYDROcodone-acetaminophen 10-325 MG tablet Commonly known  as: Norco Take 1-2 tablets by mouth daily as needed for moderate pain or severe pain.   loratadine-pseudoephedrine 10-240 MG 24 hr tablet Commonly known as: CLARITIN-D 24-hour Take 1 tablet by mouth daily.   methocarbamol 500 MG tablet Commonly known as: Robaxin Take 1 tablet (500 mg total) by mouth 2 (two) times daily as needed.   metoCLOPramide 10 MG tablet Commonly known as: REGLAN Take 10 mg by mouth 4 (four) times daily as needed (hiccups).   omeprazole 20 MG capsule Commonly known as: PRILOSEC Take 40 mg by mouth daily before breakfast.   ondansetron 4 MG tablet Commonly known as: Zofran Take 1 tablet (4 mg total) by mouth every 8 (eight) hours as needed for nausea or vomiting.   oxyCODONE-acetaminophen 5-325 MG tablet Commonly known as: Percocet Take 1-2 tablets by mouth every 6 (six) hours as needed. To be  taken after surgery   potassium chloride 10 MEQ tablet Commonly known as: KLOR-CON M Take 4 pills twice daily until day of surgery   Vitamin B-12 2500 MCG Subl Take 2,500 mcg by mouth daily.   vitamin E 180 MG (400 UNITS) capsule Take 400 Units by mouth daily.         ROS:  {Review of Systems:30496}  There were no vitals taken for this visit. Physical Exam  Results: No results found for this or any previous visit (from the past 48 hour(s)).  No results found.   Assessment & Plan:  Eugene Hardin is a 60 y.o. male with *** -*** -*** -Follow up ***  All questions were answered to the satisfaction of the patient and family***.  The risk and benefits of *** were discussed including but not limited to ***.  After careful consideration, Eugene Hardin has decided to ***.    Alaine Loughney A Kay Ricciuti 05/08/2022, 2:00 PM   Theophilus Kinds, DO [facility] [address] [place] [place] [place], [place] [address] [phone] (office)

## 2022-05-09 NOTE — H&P (Signed)
[facility] History and Physical  Reason for Referral:*** Referring Physician: ***    Eugene Hardin is a 61 y.o. male.  HPI: ***.  The *** started *** and has had a duration of ***.  It is associated with ***.  The *** is improved with ***, and is made worse with ***.    Quality*** Context***  Past Medical History:  Diagnosis Date  . Anxiety   . Arthritis   . Back pain   . Chronic pain   . Depression   . Fibromyalgia   . GERD (gastroesophageal reflux disease)   . Hyperlipidemia   . Hypertension   . Neuropathy   . Pneumonia     Past Surgical History:  Procedure Laterality Date  . CARPAL TUNNEL RELEASE Left   . MANDIBLE RECONSTRUCTION    . SHOULDER SURGERY Left   . TOTAL KNEE ARTHROPLASTY Right 05/08/2021   Procedure: RIGHT TOTAL KNEE ARTHROPLASTY;  Surgeon: Xu, Naiping M, MD;  Location: [facility];  Service: Orthopedics;  Laterality: Right;  . WOUND EXPLORATION Right 01/11/2016   Procedure: RIGHT WRIST EXPLORATION, RIGHT WRIST NERVE, TENDON AND ARTERY  REPAIR;  Surgeon: Matthew Weingold, MD;  Location: [facility];  Service: Orthopedics;  Laterality: Right;    Family History  Problem Relation Age of Onset  . Brain cancer Mother   . Diabetes Father     Social History   Tobacco Use  . Smoking status: Never  . Smokeless tobacco: Never  Vaping Use  . Vaping Use: Never used  Substance Use Topics  . Alcohol use: Yes    Comment: social  . Drug use: No    Medications: {medication reviewed/display:3041432} Allergies as of 05/08/2022       Reactions   Tramadol Anaphylaxis   Citalopram Hydrobromide    Sleepy   Duloxetine Hcl    Sleepy   Lisinopril-hydrochlorothiazide    irritability   Sertraline Hcl    Sleepy   Venlafaxine    GI upset   Vinegar [acetic Acid]    hiccups   Septra [sulfamethoxazole-trimethoprim] Rash        Medication List        Accurate as of May 08, 2022  2:00 PM. If you have any questions,  ask your nurse or doctor.          ALPRAZolam 1 MG tablet Commonly known as: XANAX Take 1 mg by mouth at bedtime.   amLODipine 5 MG tablet Commonly known as: NORVASC Take 5 mg by mouth daily.   ascorbic acid 500 MG tablet Commonly known as: VITAMIN C Take 500 mg by mouth daily.   aspirin EC 81 MG tablet Take 1 tablet (81 mg total) by mouth 2 (two) times daily. To be taken after surgery to prevent blood clots What changed: when to take this   atorvastatin 40 MG tablet Commonly known as: LIPITOR Take 40 mg by mouth every evening.   azelastine 0.1 % nasal spray Commonly known as: ASTELIN Place 2 sprays into both nostrils 2 (two) times daily as needed for allergies.   B-complex with vitamin C tablet Take 1 tablet by mouth daily.   CLEAR EYES OP Place 1 drop into both eyes daily as needed (irritation).   FLUoxetine 10 MG capsule Commonly known as: PROZAC Take 10 mg by mouth 2 (two) times daily.   gabapentin 400 MG capsule Commonly known as: NEURONTIN Take 400 mg by mouth at bedtime.   HYDROcodone-acetaminophen 10-325 MG tablet Commonly known   as: Norco Take 1-2 tablets by mouth daily as needed for moderate pain or severe pain.   loratadine-pseudoephedrine 10-240 MG 24 hr tablet Commonly known as: CLARITIN-D 24-hour Take 1 tablet by mouth daily.   methocarbamol 500 MG tablet Commonly known as: Robaxin Take 1 tablet (500 mg total) by mouth 2 (two) times daily as needed.   metoCLOPramide 10 MG tablet Commonly known as: REGLAN Take 10 mg by mouth 4 (four) times daily as needed (hiccups).   omeprazole 20 MG capsule Commonly known as: PRILOSEC Take 40 mg by mouth daily before breakfast.   ondansetron 4 MG tablet Commonly known as: Zofran Take 1 tablet (4 mg total) by mouth every 8 (eight) hours as needed for nausea or vomiting.   oxyCODONE-acetaminophen 5-325 MG tablet Commonly known as: Percocet Take 1-2 tablets by mouth every 6 (six) hours as needed. To be  taken after surgery   potassium chloride 10 MEQ tablet Commonly known as: KLOR-CON M Take 4 pills twice daily until day of surgery   Vitamin B-12 2500 MCG Subl Take 2,500 mcg by mouth daily.   vitamin E 180 MG (400 UNITS) capsule Take 400 Units by mouth daily.         ROS:  {Review of Systems:30496}  There were no vitals taken for this visit. Physical Exam  Results: No results found for this or any previous visit (from the past 48 hour(s)).  No results found.   Assessment & Plan:  Eugene Hardin is a 61 y.o. male with *** -*** -*** -Follow up ***  All questions were answered to the satisfaction of the patient and family***.  The risk and benefits of *** were discussed including but not limited to ***.  After careful consideration, Eugene Hardin has decided to ***.    Eugene Hardin A Eugene Hardin 05/08/2022, 2:00 PM   Eugene Cottone, DO [facility] [address] [place], [place] [address] [phone] (office)        Mood and Affect: Mood normal.        Behavior: Behavior normal.     Results: CT abdomen and pelvis (10/13/2020): IMPRESSION: 1. No acute findings in the abdomen or pelvis. Specifically, no findings to explain the patient's history of rectal bleeding. 2. 13 mm hypoattenuating lesion interpolar left kidney has attenuation higher than would be expected for a simple cyst. This may be a cyst complicated by proteinaceous debris or hemorrhage, but neoplasm cannot be excluded. MRI of the abdomen without and with contrast recommended to further evaluate. 3. 2 mm nonobstructing left renal stone. 4. Tiny hiatal hernia. 5. Aortic Atherosclerosis (ICD10-I70.0). -Periumbilical hernia containing only fat, sac measuring 5 x 5 x 4 cm  Assessment & Plan:  Eugene Hardin is a 61 y.o. male who presents for evaluation of umbilical hernia.  -Discussed the pathophysiology of umbilical hernias, and why we recommend surgical repair -The risk and benefits of robotic assisted laparoscopic umbilical hernia repair with mesh were discussed including but not limited to bleeding, infection, injury to surrounding structures, need for additional procedures, and hernia recurrence.  After careful consideration, Eugene Hardin has decided to proceed  with surgery. -Patient tentatively scheduled for surgery on 06/01/2022 -Information provided to the patient regarding hernias -Advised him to present to the emergency department if he begins to have nonreducible bulge, worsening pain at hernia site, nausea, vomiting, and obstipation  All questions were answered to the satisfaction of the patient.  Eugene Kinds, DO [facility] [address] [place] [place], [place] [address] [phone] (office)

## 2022-05-28 NOTE — Patient Instructions (Signed)
Eugene Hardin [facility]  05/28/2022     @PREFPERIOPPHARMACY @   Your procedure is scheduled on  06/01/2022.   Report to [facility] at  0800  A.M.   Call this number if you have problems the morning of surgery:  [phone]  If you experience any cold or flu symptoms such as cough, fever, chills, shortness of breath, etc. between now and your scheduled surgery, please notify us at the above number.   Remember:  Do not eat or drink after midnight.      Take these medicines the morning of surgery with A SIP OF WATER         Amlodipine, fluoxetine, norco (if needed), claritin, reglan, omeprazole.     Do not wear jewelry, make-up or nail polish.  Do not wear lotions, powders, or perfumes, or deodorant.  Do not shave 48 hours prior to surgery.  Men may shave face and neck.  Do not bring valuables to the hospital.  [facility] is not responsible for any belongings or valuables.  Contacts, dentures or bridgework may not be worn into surgery.  Leave your suitcase in the car.  After surgery it may be brought to your room.  For patients admitted to the hospital, discharge time will be determined by your treatment team.  Patients discharged the day of surgery will not be allowed to drive home and must have someone with them for 24 hours.    Special instructions:   DO NOT smoke tobacco or vape for 24 hours before your procedure.  Please read over the following fact sheets that you were given. Coughing and Deep Breathing, Blood Transfusion Information, Surgical Site Infection Prevention, Anesthesia Post-op Instructions, and Care and Recovery After Surgery        Laparoscopic Ventral Hernia Repair, Care After The following information offers guidance on how to care for yourself after your procedure. Your health care provider may also give you more specific instructions. If you have problems or questions, contact your health care provider. What can I expect after the  procedure? After the procedure, it is common to have pain, discomfort, or soreness. Follow these instructions at home: Medicines Take over-the-counter and prescription medicines only as told by your health care provider. Ask your health care provider if the medicine prescribed to you: Requires you to avoid driving or using machinery. Can cause constipation. You may need to take these actions to prevent or treat constipation: Drink enough fluid to keep your urine pale yellow. Take over-the-counter or prescription medicines. Eat foods that are high in fiber, such as beans, whole grains, and fresh fruits and vegetables. Limit foods that are high in fat and processed sugars, such as fried or sweet foods. Incision care  Follow instructions from your health care provider about how to take care of your incisions. Make sure you: Wash your hands with soap and water for at least 20 seconds before and after you change your bandage (dressing) or before you touch your abdomen. If soap and water are not available, use hand sanitizer. Change your dressing as told by your health care provider. Leave stitches (sutures), skin glue, or adhesive strips in place. These skin closures may need to stay in place for 2 weeks or longer. If adhesive strip edges start to loosen and curl up, you may trim the loose edges. Do not remove adhesive strips completely unless your health care provider tells you to do that. Check your incision areas every day for  signs of infection. Check for: More redness, swelling, or pain. Fluid or blood. Warmth. Pus or a bad smell. Bathing  Do not take baths, swim, or use a hot tub until your health care provider approves. Ask your health care provider if you may take showers. You may only be allowed to take sponge baths. Keep your dressing dry until your health care provider says it can be removed. Activity  Rest as told by your health care provider. Avoid sitting for a long time  without moving. Get up to take short walks every 1-2 hours. This is important to improve blood flow and breathing. Ask for help if you feel weak or unsteady. Do not lift anything that is heavier than 10 lb (4.5 kg), or the limit that you are told, until your health care provider says that it is safe. If you were given a sedative during the procedure, it can affect you for several hours. Do not drive or operate machinery until your health care provider says that it is safe. Return to your normal activities as told by your health care provider. Ask your health care provider what activities are safe for you. General instructions  Hold a pillow over your abdomen when you cough or sneeze. This helps with pain. Do not use any products that contain nicotine or tobacco. These products include cigarettes, chewing tobacco, and vaping devices, such as e-cigarettes. These can delay healing after surgery. If you need help quitting, ask your health care provider. You may be asked to continue to do deep breathing exercises at home. This will help to prevent a lung infection. Keep all follow-up visits. This is important. Contact a health care provider if: You have any of these signs of infection: More redness, swelling, or pain around an incision. Fluid or blood coming from an incision. Warmth coming from an incision. Pus or a bad smell coming from an incision. A fever or chills. You have pain that gets worse or does not get better with medicine. You have nausea or vomiting. You have a cough. You have shortness of breath. You have not had a bowel movement in 3 days. You are not able to urinate. Get help right away if you have: Severe pain in your abdomen. Persistent nausea and vomiting. Redness, warmth, or pain in your leg. Chest pain. Trouble breathing. These symptoms may represent a serious problem that is an emergency. Do not wait to see if the symptoms will go away. Get medical help right away. Call  your local emergency services (911 in the [place] Do not drive yourself to the hospital. Summary After this procedure, it is common to have pain, discomfort, or soreness. Follow instructions from your health care provider about how to take care of your incision. Check your incision area every day for signs of infection. Report any signs of infection to your health care provider. Keep all follow-up visits. This is important. This information is not intended to replace advice given to you by your health care provider. Make sure you discuss any questions you have with your health care provider. Document Revised: 12/25/2019 Document Reviewed: 12/25/2019 Elsevier Patient Education  2023 Else[employer]. General Anesthesia, Adult, Care After The following information offers guidance on how to care for yourself after your procedure. Your health care provider may also give you more specific instructions. If you have problems or questions, contact your health care provider. What can I expect after the procedure? After the procedure, it is common for people to: Have  pain or discomfort at the IV site. Have nausea or vomiting. Have a sore throat or hoarseness. Have trouble concentrating. Feel cold or chills. Feel weak, sleepy, or tired (fatigue). Have soreness and body aches. These can affect parts of the body that were not involved in surgery. Follow these instructions at home: For the time period you were told by your health care provider:  Rest. Do not participate in activities where you could fall or become injured. Do not drive or use machinery. Do not drink alcohol. Do not take sleeping pills or medicines that cause drowsiness. Do not make important decisions or sign legal documents. Do not take care of children on your own. General instructions Drink enough fluid to keep your urine pale yellow. If you have sleep apnea, surgery and certain medicines can increase your risk for breathing  problems. Follow instructions from your health care provider about wearing your sleep device: Anytime you are sleeping, including during daytime naps. While taking prescription pain medicines, sleeping medicines, or medicines that make you drowsy. Return to your normal activities as told by your health care provider. Ask your health care provider what activities are safe for you. Take over-the-counter and prescription medicines only as told by your health care provider. Do not use any products that contain nicotine or tobacco. These products include cigarettes, chewing tobacco, and vaping devices, such as e-cigarettes. These can delay incision healing after surgery. If you need help quitting, ask your health care provider. Contact a health care provider if: You have nausea or vomiting that does not get better with medicine. You vomit every time you eat or drink. You have pain that does not get better with medicine. You cannot urinate or have bloody urine. You develop a skin rash. You have a fever. Get help right away if: You have trouble breathing. You have chest pain. You vomit blood. These symptoms may be an emergency. Get help right away. Call 911. Do not wait to see if the symptoms will go away. Do not drive yourself to the hospital. Summary After the procedure, it is common to have a sore throat, hoarseness, nausea, vomiting, or to feel weak, sleepy, or fatigue. For the time period you were told by your health care provider, do not drive or use machinery. Get help right away if you have difficulty breathing, have chest pain, or vomit blood. These symptoms may be an emergency. This information is not intended to replace advice given to you by your health care provider. Make sure you discuss any questions you have with your health care provider. Document Revised: 08/04/2021 Document Reviewed: 08/04/2021 Elsevier Patient Education  2023 Elsevier Inc. How to Use Chlorhexidine Before  Surgery Chlorhexidine gluconate (CHG) is a germ-killing (antiseptic) solution that is used to clean the skin. It can get rid of the bacteria that normally live on the skin and can keep them away for about 24 hours. To clean your skin with CHG, you may be given: A CHG solution to use in the shower or as part of a sponge bath. A prepackaged cloth that contains CHG. Cleaning your skin with CHG may help lower the risk for infection: While you are staying in the intensive care unit of the hospital. If you have a vascular access, such as a central line, to provide short-term or long-term access to your veins. If you have a catheter to drain urine from your bladder. If you are on a ventilator. A ventilator is a machine that helps you breathe by  moving air in and out of your lungs. After surgery. What are the risks? Risks of using CHG include: A skin reaction. Hearing loss, if CHG gets in your ears and you have a perforated eardrum. Eye injury, if CHG gets in your eyes and is not rinsed out. The CHG product catching fire. Make sure that you avoid smoking and flames after applying CHG to your skin. Do not use CHG: If you have a chlorhexidine allergy or have previously reacted to chlorhexidine. On babies younger than 59 months of age. How to use CHG solution Use CHG only as told by your health care provider, and follow the instructions on the label. Use the full amount of CHG as directed. Usually, this is one bottle. During a shower Follow these steps when using CHG solution during a shower (unless your health care provider gives you different instructions): Start the shower. Use your normal soap and shampoo to wash your face and hair. Turn off the shower or move out of the shower stream. Pour the CHG onto a clean washcloth. Do not use any type of brush or rough-edged sponge. Starting at your neck, lather your body down to your toes. Make sure you follow these instructions: If you will be having  surgery, pay special attention to the part of your body where you will be having surgery. Scrub this area for at least 1 minute. Do not use CHG on your head or face. If the solution gets into your ears or eyes, rinse them well with water. Avoid your genital area. Avoid any areas of skin that have broken skin, cuts, or scrapes. Scrub your back and under your arms. Make sure to wash skin folds. Let the lather sit on your skin for 1-2 minutes or as long as told by your health care provider. Thoroughly rinse your entire body in the shower. Make sure that all body creases and crevices are rinsed well. Dry off with a clean towel. Do not put any substances on your body afterward--such as powder, lotion, or perfume--unless you are told to do so by your health care provider. Only use lotions that are recommended by the manufacturer. Put on clean clothes or pajamas. If it is the night before your surgery, sleep in clean sheets.  During a sponge bath Follow these steps when using CHG solution during a sponge bath (unless your health care provider gives you different instructions): Use your normal soap and shampoo to wash your face and hair. Pour the CHG onto a clean washcloth. Starting at your neck, lather your body down to your toes. Make sure you follow these instructions: If you will be having surgery, pay special attention to the part of your body where you will be having surgery. Scrub this area for at least 1 minute. Do not use CHG on your head or face. If the solution gets into your ears or eyes, rinse them well with water. Avoid your genital area. Avoid any areas of skin that have broken skin, cuts, or scrapes. Scrub your back and under your arms. Make sure to wash skin folds. Let the lather sit on your skin for 1-2 minutes or as long as told by your health care provider. Using a different clean, wet washcloth, thoroughly rinse your entire body. Make sure that all body creases and crevices are  rinsed well. Dry off with a clean towel. Do not put any substances on your body afterward--such as powder, lotion, or perfume--unless you are told to do so by  your health care provider. Only use lotions that are recommended by the manufacturer. Put on clean clothes or pajamas. If it is the night before your surgery, sleep in clean sheets. How to use CHG prepackaged cloths Only use CHG cloths as told by your health care provider, and follow the instructions on the label. Use the CHG cloth on clean, dry skin. Do not use the CHG cloth on your head or face unless your health care provider tells you to. When washing with the CHG cloth: Avoid your genital area. Avoid any areas of skin that have broken skin, cuts, or scrapes. Before surgery Follow these steps when using a CHG cloth to clean before surgery (unless your health care provider gives you different instructions): Using the CHG cloth, vigorously scrub the part of your body where you will be having surgery. Scrub using a back-and-forth motion for 3 minutes. The area on your body should be completely wet with CHG when you are done scrubbing. Do not rinse. Discard the cloth and let the area air-dry. Do not put any substances on the area afterward, such as powder, lotion, or perfume. Put on clean clothes or pajamas. If it is the night before your surgery, sleep in clean sheets.  For general bathing Follow these steps when using CHG cloths for general bathing (unless your health care provider gives you different instructions). Use a separate CHG cloth for each area of your body. Make sure you wash between any folds of skin and between your fingers and toes. Wash your body in the following order, switching to a new cloth after each step: The front of your neck, shoulders, and chest. Both of your arms, under your arms, and your hands. Your stomach and groin area, avoiding the genitals. Your right leg and foot. Your left leg and foot. The back of  your neck, your back, and your buttocks. Do not rinse. Discard the cloth and let the area air-dry. Do not put any substances on your body afterward--such as powder, lotion, or perfume--unless you are told to do so by your health care provider. Only use lotions that are recommended by the manufacturer. Put on clean clothes or pajamas. Contact a health care provider if: Your skin gets irritated after scrubbing. You have questions about using your solution or cloth. You swallow any chlorhexidine. Call your local poison control center ([phone] in the [place] Get help right away if: Your eyes itch badly, or they become very red or swollen. Your skin itches badly and is red or swollen. Your hearing changes. You have trouble seeing. You have swelling or tingling in your mouth or throat. You have trouble breathing. These symptoms may represent a serious problem that is an emergency. Do not wait to see if the symptoms will go away. Get medical help right away. Call your local emergency services (911 in the [place] Do not drive yourself to the hospital. Summary Chlorhexidine gluconate (CHG) is a germ-killing (antiseptic) solution that is used to clean the skin. Cleaning your skin with CHG may help to lower your risk for infection. You may be given CHG to use for bathing. It may be in a bottle or in a prepackaged cloth to use on your skin. Carefully follow your health care provider's instructions and the instructions on the product label. Do not use CHG if you have a chlorhexidine allergy. Contact your health care provider if your skin gets irritated after scrubbing. This information is not intended to replace advice given to you by  your health care provider. Make sure you discuss any questions you have with your health care provider. Document Revised: 09/04/2021 Document Reviewed: 07/18/2020 Elsevier Patient Education  [place].

## 2022-05-30 ENCOUNTER — Encounter (HOSPITAL_COMMUNITY)
Admission: RE | Admit: 2022-05-30 | Discharge: 2022-05-30 | Disposition: A | Discharge status: Home / Self Care | Payer: Commercial Managed Care - HMO | Source: Ambulatory Visit | Attending: Surgery | Admitting: Surgery

## 2022-05-30 ENCOUNTER — Encounter (HOSPITAL_COMMUNITY): Payer: Self-pay

## 2022-05-30 VITALS — BP 150/88 | HR 82 | Temp 97.8°F | Resp 18 | Ht 64.0 in | Wt 220.9 lb

## 2022-05-30 DIAGNOSIS — R9431 Abnormal electrocardiogram [ECG] [EKG]: Secondary | ICD-10-CM | POA: Diagnosis not present

## 2022-05-30 DIAGNOSIS — Z01818 Encounter for other preprocedural examination: Secondary | ICD-10-CM | POA: Insufficient documentation

## 2022-05-30 DIAGNOSIS — I10 Essential (primary) hypertension: Secondary | ICD-10-CM | POA: Diagnosis not present

## 2022-05-30 LAB — TYPE AND SCREEN
ABO/RH(D): O POS
Antibody Screen: NEGATIVE

## 2022-06-01 ENCOUNTER — Other Ambulatory Visit: Payer: Self-pay

## 2022-06-01 ENCOUNTER — Encounter (HOSPITAL_COMMUNITY): Payer: Self-pay | Admitting: Surgery

## 2022-06-01 ENCOUNTER — Ambulatory Visit (HOSPITAL_COMMUNITY)
Admission: RE | Admit: 2022-06-01 | Discharge: 2022-06-01 | Disposition: A | Discharge status: Home / Self Care | Payer: Commercial Managed Care - HMO | Attending: Surgery | Admitting: Surgery

## 2022-06-01 ENCOUNTER — Ambulatory Visit (HOSPITAL_COMMUNITY): Payer: Commercial Managed Care - HMO | Admitting: Anesthesiology

## 2022-06-01 ENCOUNTER — Encounter (HOSPITAL_COMMUNITY)
Admission: RE | Disposition: A | Discharge status: Home / Self Care | Payer: Self-pay | Source: Home / Self Care | Attending: Surgery

## 2022-06-01 ENCOUNTER — Ambulatory Visit (HOSPITAL_BASED_OUTPATIENT_CLINIC_OR_DEPARTMENT_OTHER): Payer: Commercial Managed Care - HMO | Admitting: Anesthesiology

## 2022-06-01 DIAGNOSIS — M797 Fibromyalgia: Secondary | ICD-10-CM | POA: Diagnosis not present

## 2022-06-01 DIAGNOSIS — M1990 Unspecified osteoarthritis, unspecified site: Secondary | ICD-10-CM | POA: Diagnosis not present

## 2022-06-01 DIAGNOSIS — I10 Essential (primary) hypertension: Secondary | ICD-10-CM | POA: Diagnosis not present

## 2022-06-01 DIAGNOSIS — E785 Hyperlipidemia, unspecified: Secondary | ICD-10-CM | POA: Insufficient documentation

## 2022-06-01 DIAGNOSIS — K429 Umbilical hernia without obstruction or gangrene: Secondary | ICD-10-CM

## 2022-06-01 DIAGNOSIS — K219 Gastro-esophageal reflux disease without esophagitis: Secondary | ICD-10-CM | POA: Diagnosis not present

## 2022-06-01 DIAGNOSIS — K420 Umbilical hernia with obstruction, without gangrene: Secondary | ICD-10-CM | POA: Diagnosis not present

## 2022-06-01 DIAGNOSIS — F32A Depression, unspecified: Secondary | ICD-10-CM | POA: Insufficient documentation

## 2022-06-01 DIAGNOSIS — F419 Anxiety disorder, unspecified: Secondary | ICD-10-CM | POA: Diagnosis not present

## 2022-06-01 DIAGNOSIS — Z79899 Other long term (current) drug therapy: Secondary | ICD-10-CM | POA: Diagnosis not present

## 2022-06-01 DIAGNOSIS — Z7982 Long term (current) use of aspirin: Secondary | ICD-10-CM | POA: Insufficient documentation

## 2022-06-01 DIAGNOSIS — Z96651 Presence of right artificial knee joint: Secondary | ICD-10-CM | POA: Diagnosis not present

## 2022-06-01 SURGERY — REPAIR, HERNIA, UMBILICAL, ROBOT-ASSISTED
Anesthesia: General | Site: Abdomen

## 2022-06-01 MED ORDER — CEFAZOLIN SODIUM-DEXTROSE 2-4 GM/100ML-% IV SOLN
2.0000 g | INTRAVENOUS | Status: AC
  Administered 2022-06-01: 2 g via INTRAVENOUS
  Filled 2022-06-01: qty 100

## 2022-06-01 MED ORDER — LACTATED RINGERS IV SOLN: INTRAVENOUS | Status: DC

## 2022-06-01 MED ORDER — ONDANSETRON HCL 4 MG/2ML IJ SOLN: 4.0000 mg | Freq: Once | INTRAMUSCULAR | Status: DC | PRN

## 2022-06-01 MED ORDER — BUPIVACAINE LIPOSOME 1.3 % IJ SUSP
INTRAMUSCULAR | Status: DC | PRN
  Administered 2022-06-01: 20 mL

## 2022-06-01 MED ORDER — PHENYLEPHRINE HCL (PRESSORS) 10 MG/ML IV SOLN
INTRAVENOUS | Status: DC | PRN
  Administered 2022-06-01: 40 ug via INTRAVENOUS
  Administered 2022-06-01 (×6): 80 ug via INTRAVENOUS

## 2022-06-01 MED ORDER — KETOROLAC TROMETHAMINE 30 MG/ML IJ SOLN
INTRAMUSCULAR | Status: AC
  Filled 2022-06-01: qty 1

## 2022-06-01 MED ORDER — BUPIVACAINE LIPOSOME 1.3 % IJ SUSP
INTRAMUSCULAR | Status: AC
  Filled 2022-06-01: qty 20

## 2022-06-01 MED ORDER — PROPOFOL 10 MG/ML IV BOLUS
INTRAVENOUS | Status: AC
  Filled 2022-06-01: qty 20

## 2022-06-01 MED ORDER — CHLORHEXIDINE GLUCONATE CLOTH 2 % EX PADS: 6.0000 | MEDICATED_PAD | Freq: Once | CUTANEOUS | Status: DC

## 2022-06-01 MED ORDER — PROPOFOL 10 MG/ML IV BOLUS
INTRAVENOUS | Status: DC | PRN
  Administered 2022-06-01: 200 mg via INTRAVENOUS

## 2022-06-01 MED ORDER — ASPIRIN EC 81 MG PO TBEC: 81.0000 mg | DELAYED_RELEASE_TABLET | Freq: Every day | ORAL | Status: DC

## 2022-06-01 MED ORDER — ACETAMINOPHEN 325 MG PO TABS: 325.0000 mg | ORAL_TABLET | Freq: Four times a day (QID) | ORAL | 0 refills | Status: AC

## 2022-06-01 MED ORDER — ROCURONIUM BROMIDE 10 MG/ML (PF) SYRINGE
PREFILLED_SYRINGE | INTRAVENOUS | Status: DC | PRN
  Administered 2022-06-01: 10 mg via INTRAVENOUS
  Administered 2022-06-01: 50 mg via INTRAVENOUS

## 2022-06-01 MED ORDER — ORAL CARE MOUTH RINSE: 15.0000 mL | Freq: Once | OROMUCOSAL | Status: AC

## 2022-06-01 MED ORDER — MIDAZOLAM HCL 2 MG/2ML IJ SOLN
INTRAMUSCULAR | Status: AC
  Filled 2022-06-01: qty 2

## 2022-06-01 MED ORDER — ONDANSETRON HCL 4 MG/2ML IJ SOLN
INTRAMUSCULAR | Status: DC | PRN
  Administered 2022-06-01: 4 mg via INTRAVENOUS

## 2022-06-01 MED ORDER — LIDOCAINE HCL (CARDIAC) PF 100 MG/5ML IV SOSY
PREFILLED_SYRINGE | INTRAVENOUS | Status: DC | PRN
  Administered 2022-06-01: 100 mg via INTRAVENOUS

## 2022-06-01 MED ORDER — 0.9 % SODIUM CHLORIDE (POUR BTL) OPTIME
TOPICAL | Status: DC | PRN
  Administered 2022-06-01: 1000 mL

## 2022-06-01 MED ORDER — FENTANYL CITRATE (PF) 100 MCG/2ML IJ SOLN
INTRAMUSCULAR | Status: AC
  Filled 2022-06-01: qty 2

## 2022-06-01 MED ORDER — ONDANSETRON HCL 4 MG/2ML IJ SOLN
INTRAMUSCULAR | Status: AC
  Filled 2022-06-01: qty 2

## 2022-06-01 MED ORDER — SUCCINYLCHOLINE CHLORIDE 200 MG/10ML IV SOSY
PREFILLED_SYRINGE | INTRAVENOUS | Status: AC
  Filled 2022-06-01: qty 10

## 2022-06-01 MED ORDER — FENTANYL CITRATE (PF) 100 MCG/2ML IJ SOLN
INTRAMUSCULAR | Status: DC | PRN
  Administered 2022-06-01 (×2): 50 ug via INTRAVENOUS

## 2022-06-01 MED ORDER — MIDAZOLAM HCL 5 MG/5ML IJ SOLN
INTRAMUSCULAR | Status: DC | PRN
  Administered 2022-06-01: 2 mg via INTRAVENOUS

## 2022-06-01 MED ORDER — SUCCINYLCHOLINE CHLORIDE 200 MG/10ML IV SOSY
PREFILLED_SYRINGE | INTRAVENOUS | Status: DC | PRN
  Administered 2022-06-01: 120 mg via INTRAVENOUS

## 2022-06-01 MED ORDER — ROCURONIUM BROMIDE 10 MG/ML (PF) SYRINGE
PREFILLED_SYRINGE | INTRAVENOUS | Status: AC
  Filled 2022-06-01: qty 10

## 2022-06-01 MED ORDER — HYDROMORPHONE HCL 1 MG/ML IJ SOLN: 0.2500 mg | INTRAMUSCULAR | Status: DC | PRN

## 2022-06-01 MED ORDER — SUGAMMADEX SODIUM 200 MG/2ML IV SOLN
INTRAVENOUS | Status: DC | PRN
  Administered 2022-06-01: 200 mg via INTRAVENOUS

## 2022-06-01 MED ORDER — EPHEDRINE SULFATE (PRESSORS) 50 MG/ML IJ SOLN
INTRAMUSCULAR | Status: DC | PRN
  Administered 2022-06-01: 5 mg via INTRAVENOUS

## 2022-06-01 MED ORDER — DEXMEDETOMIDINE HCL IN NACL 80 MCG/20ML IV SOLN
INTRAVENOUS | Status: DC | PRN
  Administered 2022-06-01 (×2): 5 ug via BUCCAL

## 2022-06-01 MED ORDER — DEXAMETHASONE SODIUM PHOSPHATE 10 MG/ML IJ SOLN
INTRAMUSCULAR | Status: DC | PRN
  Administered 2022-06-01: 10 mg via INTRAVENOUS

## 2022-06-01 MED ORDER — CHLORHEXIDINE GLUCONATE 0.12 % MT SOLN
15.0000 mL | Freq: Once | OROMUCOSAL | Status: AC
  Administered 2022-06-01: 15 mL via OROMUCOSAL

## 2022-06-01 MED ORDER — KETOROLAC TROMETHAMINE 30 MG/ML IJ SOLN
INTRAMUSCULAR | Status: DC | PRN
  Administered 2022-06-01: 15 mg via INTRAVENOUS

## 2022-06-01 SURGICAL SUPPLY — 54 items
ADH SKN CLS APL DERMABOND .7 (GAUZE/BANDAGES/DRESSINGS) ×1
APL PRP STRL LF DISP 70% ISPRP (MISCELLANEOUS) ×1
BINDER ABDOMINAL 12 XL 75-84 (SOFTGOODS) IMPLANT
BLADE SURG 15 STRL LF DISP TIS (BLADE) ×2 IMPLANT
BLADE SURG 15 STRL SS (BLADE) ×1
CHLORAPREP W/TINT 26 (MISCELLANEOUS) ×2 IMPLANT
COVER MAYO STAND XLG (MISCELLANEOUS) IMPLANT
COVER SURGICAL LIGHT HANDLE (MISCELLANEOUS) ×4 IMPLANT
COVER TIP SHEARS 8 DVNC (MISCELLANEOUS) ×2 IMPLANT
COVER TIP SHEARS 8MM DA VINCI (MISCELLANEOUS) ×1
DEFOGGER SCOPE WARMER CLEARIFY (MISCELLANEOUS) IMPLANT
DERMABOND ADVANCED .7 DNX12 (GAUZE/BANDAGES/DRESSINGS) ×2 IMPLANT
DRAPE ARM DVNC X/XI (DISPOSABLE) ×6 IMPLANT
DRAPE COLUMN DVNC XI (DISPOSABLE) ×2 IMPLANT
DRAPE DA VINCI XI ARM (DISPOSABLE) ×3
DRAPE DA VINCI XI COLUMN (DISPOSABLE) ×1
ELECT REM PT RETURN 9FT ADLT (ELECTROSURGICAL) ×1
ELECTRODE REM PT RTRN 9FT ADLT (ELECTROSURGICAL) ×2 IMPLANT
GLOVE BIOGEL PI IND STRL 6.5 (GLOVE) ×4 IMPLANT
GLOVE BIOGEL PI IND STRL 7.0 (GLOVE) ×4 IMPLANT
GLOVE SURG SS PI 6.5 STRL IVOR (GLOVE) ×4 IMPLANT
GOWN STRL REUS W/TWL LRG LVL3 (GOWN DISPOSABLE) ×6 IMPLANT
GRASPER SUT TROCAR 14GX15 (MISCELLANEOUS) IMPLANT
IRRIGATOR SUCT 8 DISP DVNC XI (IRRIGATION / IRRIGATOR) IMPLANT
IRRIGATOR SUCTION 8MM XI DISP (IRRIGATION / IRRIGATOR)
KIT TURNOVER KIT A (KITS) ×2 IMPLANT
MANIFOLD NEPTUNE II (INSTRUMENTS) ×2 IMPLANT
MESH VENTRALIGHT ST 4.5IN (Mesh General) IMPLANT
NDL HYPO 21X1.5 SAFETY (NEEDLE) ×2 IMPLANT
NDL INSUFFLATION 14GA 120MM (NEEDLE) ×2 IMPLANT
NEEDLE HYPO 21X1.5 SAFETY (NEEDLE) ×1 IMPLANT
NEEDLE HYPO 22GX1.5 SAFETY (NEEDLE) ×2 IMPLANT
NEEDLE INSUFFLATION 14GA 120MM (NEEDLE) ×1 IMPLANT
OBTURATOR OPTICAL STANDARD 8MM (TROCAR) ×1
OBTURATOR OPTICAL STND 8 DVNC (TROCAR) ×1
OBTURATOR OPTICALSTD 8 DVNC (TROCAR) ×2 IMPLANT
PACK LAP CHOLE LZT030E (CUSTOM PROCEDURE TRAY) ×2 IMPLANT
PENCIL SMOKE EVACUATOR (MISCELLANEOUS) ×2 IMPLANT
SEAL CANN UNIV 5-8 DVNC XI (MISCELLANEOUS) ×6 IMPLANT
SEAL XI 5MM-8MM UNIVERSAL (MISCELLANEOUS) ×3
SET BASIN LINEN APH (SET/KITS/TRAYS/PACK) ×2 IMPLANT
SET TUBE SMOKE EVAC HIGH FLOW (TUBING) ×2 IMPLANT
SOLUTION ELECTROLUBE (MISCELLANEOUS) ×2 IMPLANT
SUT MNCRL AB 4-0 PS2 18 (SUTURE) ×2 IMPLANT
SUT STRATAFIX 0 PDS+ CT-2 23 (SUTURE) ×1
SUT V-LOC 90 ABS DVC 3-0 CL (SUTURE) ×4 IMPLANT
SUT VLOC BARB 180 ABS3/0GR12 (SUTURE) ×1
SUTURE STRATFX 0 PDS+ CT-2 23 (SUTURE) ×2 IMPLANT
SUTURE VLOC BRB 180 ABS3/0GR12 (SUTURE) IMPLANT
SYR 20ML LL LF (SYRINGE) ×2 IMPLANT
TAPE TRANSPORE STRL 2 31045 (GAUZE/BANDAGES/DRESSINGS) IMPLANT
TRAY FOLEY W/BAG SLVR 16FR (SET/KITS/TRAYS/PACK) ×1
TRAY FOLEY W/BAG SLVR 16FR ST (SET/KITS/TRAYS/PACK) ×2 IMPLANT
WATER STERILE IRR 500ML POUR (IV SOLUTION) ×2 IMPLANT

## 2022-06-01 NOTE — Interval H&P Note (Signed)
History and Physical Interval Note:  06/01/2022 9:57 AM  Eugene Hardin  has presented today for surgery, with the diagnosis of UMBILICAL HERNIA, 2 CM.  The various methods of treatment have been discussed with the patient and family. After consideration of risks, benefits and other options for treatment, the patient has consented to  Procedure(s): XI [place] (N/A) as a surgical intervention.  The patient's history has been reviewed, patient examined, no change in status, stable for surgery.  I have reviewed the patient's chart and labs.  Questions were answered to the patient's satisfaction.     [place]

## 2022-06-01 NOTE — Progress Notes (Signed)
[facility]  Spoke with the patient's wife in the consultation room.  I explained that he tolerated the procedure without difficulty.  He has dissolvable stitches under the skin with overlying skin glue.  This will flake off in 10 to 14 days.  He will use his already prescribed narcotic pain medication to help with pain control.  I want him taking scheduled Tylenol. The patient will follow-up with me in 2 weeks.  All questions were answered to her expressed satisfaction.  Graciella Freer, DO [facility] [address] [place], [place] [address] [phone] (office)

## 2022-06-01 NOTE — Anesthesia Preprocedure Evaluation (Signed)
Anesthesia Evaluation  Patient identified by MRN, date of birth, ID band Patient awake    Reviewed: Allergy & Precautions, NPO status , Patient's Chart, lab work & pertinent test results  Airway Mallampati: II       Dental no notable dental hx.    Pulmonary    Pulmonary exam normal        Cardiovascular Exercise Tolerance: Good hypertension, Pt. on medications Normal cardiovascular exam     Neuro/Psych  PSYCHIATRIC DISORDERS Anxiety Depression     Neuromuscular disease (fibromyalgia)    GI/Hepatic ,GERD  Medicated,,  Endo/Other  negative endocrine ROS    Renal/GU   negative genitourinary   Musculoskeletal  (+) Arthritis , Osteoarthritis,  Fibromyalgia -  Abdominal  (+) + obese  Peds  Hematology negative hematology ROS (+)   Anesthesia Other Findings   Reproductive/Obstetrics                             Anesthesia Physical Anesthesia Plan  ASA: 2  Anesthesia Plan: General   Post-op Pain Management:    Induction: Intravenous  PONV Risk Score and Plan: Dexamethasone and Ondansetron  Airway Management Planned: Oral ETT  Additional Equipment:   Intra-op Plan:   Post-operative Plan: Extubation in OR  Informed Consent: I have reviewed the patients History and Physical, chart, labs and discussed the procedure including the risks, benefits and alternatives for the proposed anesthesia with the patient or authorized representative who has indicated his/her understanding and acceptance.       Plan Discussed with: CRNA  Anesthesia Plan Comments:         Anesthesia Quick Evaluation

## 2022-06-01 NOTE — Anesthesia Procedure Notes (Signed)
Procedure Name: Intubation Date/Time: 06/01/2022 10:15 AM  Performed by: Annamary Carolin, CRNAPre-anesthesia Checklist: Patient identified, Emergency Drugs available, Suction available, Patient being monitored and Timeout performed Patient Re-evaluated:Patient Re-evaluated prior to induction Oxygen Delivery Method: Circle System Utilized Preoxygenation: Pre-oxygenation with 100% oxygen Induction Type: IV induction Ventilation: Mask ventilation without difficulty Laryngoscope Size: Mac and 3 Grade View: Grade I Tube type: Oral Number of attempts: 1 Airway Equipment and Method: Stylet and Oral airway Placement Confirmation: ETT inserted through vocal cords under direct vision, positive ETCO2 and breath sounds checked- equal and bilateral Secured at: 22 cm Tube secured with: Tape Dental Injury: Teeth and Oropharynx as per pre-operative assessment  Comments: With ease x1.

## 2022-06-01 NOTE — Transfer of Care (Signed)
Immediate Anesthesia Transfer of Care Note  Patient: Eugene Hardin [facility]  Procedure(s) Performed: XI ROBOT ASSISTED UMBILICAL HERNIA REPAIR W/ MESH (Abdomen)  Patient Location: PACU  Anesthesia Type:General  Level of Consciousness: awake, alert , oriented, and patient cooperative  Airway & Oxygen Therapy: Patient Spontanous Breathing and Patient connected to nasal cannula oxygen  Post-op Assessment: Report given to RN, Post -op Vital signs reviewed and stable, and Patient moving all extremities X 4  Post vital signs: Reviewed and stable  Last Vitals:  Vitals Value Taken Time  BP 134/84 06/01/22 1316  Temp    Pulse 93 06/01/22 1318  Resp 13 06/01/22 1318  SpO2 93 % 06/01/22 1318  Vitals shown include unvalidated device data.  Last Pain:  Vitals:   06/01/22 0858  TempSrc: Oral  PainSc: 0-No pain         Complications: No notable events documented.

## 2022-06-01 NOTE — Discharge Instructions (Signed)
Ambulatory Surgery Discharge Instructions  General Anesthesia or Sedation Do not drive or operate heavy machinery for 24 hours.  Do not consume alcohol, tranquilizers, sleeping medications, or any non-prescribed medications for 24 hours. Do not make important decisions or sign any important papers in the next 24 hours. You should have someone with you tonight at home.  Activity  You are advised to go directly home from the hospital.  Restrict your activities and rest for a day.  Resume light activity tomorrow. No heavy lifting over 10 lbs or strenuous exercise. Wear your abdominal binder at all times except while sleeping and bathing for the next week  Fluids and Diet Begin with clear liquids, bouillon, dry toast, soda crackers.  If not nauseated, you may go to a regular diet when you desire.  Greasy and spicy foods are not advised.  Medications  If you have not had a bowel movement in 24 hours, take 2 tablespoons over the counter Milk of mag.             You May resume your blood thinners tomorrow (Aspirin, coumadin, or other).   Operative Site  You have a liquid bandage over your incisions, this will begin to flake off in about a week. You have a bandage at your belly button.  Leave this on for 2-3 days or remove if becomes saturated in shower. Ok to Games developer. Keep wound clean and dry. No baths or swimming. No lifting more than 10 pounds.  Contact Information: If you have questions or concerns, please call our office, [phone], Monday- Thursday 8AM-5PM and Friday 8AM-12Noon.  If it is after hours or on the weekend, please call [facility], [phone], and ask to speak to the surgeon on call for Dr. Okey Dupre at [facility].   SPECIFIC COMPLICATIONS TO WATCH FOR: Inability to urinate Fever over 101? F by mouth Nausea and vomiting lasting longer than 24 hours. Pain not relieved by medication ordered Swelling around the operative site Increased redness, warmth,  hardness, around operative area Numbness, tingling, or cold fingers or toes Blood -soaked dressing, (small amounts of oozing may be normal) Increasing and progressive drainage from surgical area or exam site

## 2022-06-01 NOTE — Op Note (Signed)
[facility]  Preoperative diagnosis: Umbilical hernia Postoperative diagnosis: same  Procedure: Robotic assisted laparoscopic umbilical hernia repair with mesh  Anesthesia: general  Surgeon: Graciella Freer, DO  Wound Classification: Clean  Specimen: none  Complications: None  Estimated Blood Loss: 58ml  Indications: Patient is a 62 year old male who presents for robotic assisted laparoscopic umbilical hernia repair with mesh.  He has had an umbilical hernia present for at least 5 years, but it is increasing in size and causing him pain.  He is agreeable to surgery.  All risks and benefits of performing this procedure were discussed with the patient including pain, infection, bleeding, damage to the surrounding structures, and need for more procedures or surgery. The patient voiced understanding of the procedure, all questions were sought and answered, and consent was obtained.  Findings: 2.5 cm umbilical hernia 2. Tension free repair achieved with Ventralight 4.5 inch mesh and suture 3. Adequate hemostasis  Description of procedure: The patient was brought to the operating room and general anesthesia was induced. A time-out was completed verifying correct patient, procedure, site, positioning, and implant(s) and/or special equipment prior to beginning this procedure. Antibiotics were administered prior to making the incision. SCDs placed. The anterior abdominal wall was prepped and draped in the standard sterile fashion.   Palmer's point chosen for entry.  Veress needle placed and abdomen insufflated to 15cm without any dramatic increase in pressure.  Needle removed and optiview technique used to place an 8 mm port at same point.  No injury noted during placement. Two additional ports, 9mm x2, were placed along left lateral aspect of the abdomen.  Xi robot then docked into place.  Hernia contents noted and reduced with combination of blunt dissection with  scissors and fenestrated forceps.  Hemostasis achieved throughout this portion.  Once all hernia contents reduced, there was noted to be a 2.5 cm umbilical hernia.    Insufflation dropped to 87mm and transfacial suture with 0 stratafix used to primarily close defect under minimal tension.  VentraLight 4.5 inch mesh was placed within the abdominal cavity through 8 mm port and secured to the abdominal wall centered over the defect using the 0 stratafix previously used to primarily close defect.  The mesh was then circumferentially sutured into the anterior abdominal wall using 2-0 V-Loc.  Any bleeding noted during this portion was no longer actively bleeding by end of securing mesh and tightening the suture.    Robot was undocked.  The ports were removed under direct visualization.  Abdomen then desufflated while camera within abdomen to ensure no signs of new bleed prior to removing camera and rest of ports completely.  All skin incisions closed with runninrg 4-0 Monocryl in a subcuticular fashion.  All wounds then dressed with Dermabond.  2 x 2 gauze and Tegaderm were placed at the umbilicus to provide a pressure dressing.  Abdominal binder was placed.  Patient was then successfully awakened and transferred to PACU in stable condition.  At the end of the procedure sponge and instrument counts were correct.  Graciella Freer, DO [facility] [address] [place], [place] [address] [phone] (office)

## 2022-06-01 NOTE — Anesthesia Postprocedure Evaluation (Signed)
Anesthesia Post Note  Patient: Eugene Hardin [facility]  Procedure(s) Performed: XI ROBOT ASSISTED UMBILICAL HERNIA REPAIR W/ MESH (Abdomen)  Patient location during evaluation: PACU Anesthesia Type: General Level of consciousness: awake and alert Pain management: pain level controlled Vital Signs Assessment: post-procedure vital signs reviewed and stable Respiratory status: spontaneous breathing, nonlabored ventilation, respiratory function stable and patient connected to nasal cannula oxygen Cardiovascular status: blood pressure returned to baseline and stable Postop Assessment: no apparent nausea or vomiting Anesthetic complications: no   There were no known notable events for this encounter.   Last Vitals:  Vitals:   06/01/22 1345 06/01/22 1356  BP: 138/71 120/82  Pulse: 96 89  Resp: 14 12  Temp:  37 C  SpO2: 94% 97%    Last Pain:  Vitals:   06/01/22 1356  TempSrc: Oral  PainSc: 0-No pain                 Trixie Rude

## 2022-06-04 DIAGNOSIS — K420 Umbilical hernia with obstruction, without gangrene: Secondary | ICD-10-CM

## 2022-06-19 ENCOUNTER — Encounter: Payer: Self-pay | Admitting: Surgery

## 2022-06-19 ENCOUNTER — Ambulatory Visit (INDEPENDENT_AMBULATORY_CARE_PROVIDER_SITE_OTHER): Payer: Commercial Managed Care - HMO | Admitting: Surgery

## 2022-06-19 VITALS — BP 127/86 | HR 99 | Temp 98.2°F | Resp 14 | Ht 64.0 in | Wt 221.0 lb

## 2022-06-19 DIAGNOSIS — Z09 Encounter for follow-up examination after completed treatment for conditions other than malignant neoplasm: Secondary | ICD-10-CM

## 2022-06-19 NOTE — Progress Notes (Unsigned)
[facility] Note   HPI:  62 y.o. Male presents to clinic for post-op follow-up status post robotic assisted laparoscopic umbilical hernia repair with mesh on 1/12.  He has been doing well postoperatively.  He only occasionally will get some twinges of pain when he is turning his body.  He is tolerating a diet without nausea and vomiting, and moving his bowels without issue.  He denies fevers and chills.  Review of Systems:  All other review of systems: otherwise negative   Vital Signs:  BP 127/86   Pulse 99   Temp 98.2 F (36.8 C) (Oral)   Resp 14   Ht 5\' 4"  (1.626 m)   Wt 221 lb (100.2 kg)   SpO2 92%   BMI 37.93 kg/m    Physical Exam:  Physical Exam Vitals reviewed.  Constitutional:      Appearance: Normal appearance.  Abdominal:     Comments: Abdomen soft, nondistended, no percussion tenderness, nontender to palpation; no rigidity, guarding, rebound tenderness; left-sided abdominal incision sites healing well, palpable fullness at previous umbilical hernia site, likely seroma  Neurological:     Mental Status: He is alert.     Laboratory studies: None   Imaging:  None  Assessment:  62 y.o. yo Male who presents for follow-up status post robotic assisted laparoscopic umbilical hernia repair with mesh on 1/12  Plan:  -Patient doing well postoperatively, tolerating a diet, pain controlled, and moving his bowels -Advised him that the fullness at his umbilicus will continue to soften with time, but it can take several months for his body to reabsorb the fluid -Advised him to call if this area does not resolve within several months or if he has worsening abdominal -Follow up as needed  All of the above recommendations were discussed with the patient and patient's family, and all of patient's and family's questions were answered to their expressed satisfaction.  Graciella Freer, DO [facility] [address] [place], [place] [address] [phone] (office)

## 2022-09-13 ENCOUNTER — Other Ambulatory Visit (INDEPENDENT_AMBULATORY_CARE_PROVIDER_SITE_OTHER): Payer: Commercial Managed Care - HMO

## 2022-09-13 ENCOUNTER — Encounter: Payer: Self-pay | Admitting: Physician Assistant

## 2022-09-13 ENCOUNTER — Ambulatory Visit (INDEPENDENT_AMBULATORY_CARE_PROVIDER_SITE_OTHER): Payer: Commercial Managed Care - HMO | Admitting: Orthopaedic Surgery

## 2022-09-13 DIAGNOSIS — M1712 Unilateral primary osteoarthritis, left knee: Secondary | ICD-10-CM | POA: Diagnosis not present

## 2022-09-13 DIAGNOSIS — Z96651 Presence of right artificial knee joint: Secondary | ICD-10-CM | POA: Diagnosis not present

## 2022-09-13 NOTE — Progress Notes (Signed)
Office Visit Note   Patient: Eugene Hardin           Date of Birth: Apr 29, 1961           MRN: [id number] Visit Date: 09/13/2022              Requested by: Blair Heys, MD [address] [place],  [place] [address] PCP: Blair Heys, MD   Assessment & Plan: Visit Diagnoses:  1. Primary osteoarthritis of left knee   2. Status post total knee replacement, right     Plan: Impression is status post right total knee replacement and left knee osteoarthritis.  In regards to the right knee, he is doing well.  Continue to advance with activity as tolerated.  Dental prophylaxis reinforced for another 8 months.  Follow-up as needed for the right knee.  In regards to the left knee, we discussed various treatment options to include cortisone injection versus total knee arthroplasty.  He is not quite ready for surgery so he is elected to proceed with cortisone injection today.  He will follow-up with [place] as needed.  Call with concerns or questions.  Follow-Up Instructions: Return if symptoms worsen or fail to improve.   Orders:  Orders Placed This Encounter  Procedures   XR KNEE 3 VIEW LEFT   XR Knee 1-2 Views Right   No orders of the defined types were placed in this encounter.     Procedures: No procedures performed   Clinical Data: No additional findings.   Subjective: Chief Complaint  Patient presents with   Left Knee - Pain    HPI patient is a pleasant 62 year old gentleman that comes in today with chronic left knee pain as well as status post right total knee replacement 05/08/2021.  He was last seen for his right knee when he was 6 weeks postop.  Right knee has been doing well.  Occasional soreness but nothing more.  In regards to his left knee has had pain that is relatively constant without any specific aggravators other than pain at the end of the day after he has been on his feet.  The pain is to the entire knee with associated stiffness, swelling and  popping.  He takes Norco for pain which he gets from his PCP for his back.  Review of Systems as detailed in HPI.  All others reviewed and are negative.   Objective: Vital Signs: There were no vitals taken for this visit.  Physical Exam well-developed well-nourished gentleman in no acute distress.  Alert and oriented x 3.  Ortho Exam  Specialty Comments:  No specialty comments available.  Imaging: XR KNEE 3 VIEW LEFT  Result Date: 09/13/2022 Advanced degenerative changes to the medial and patellofemoral compartments.  Does have chronic appearing fragmentation to the tibial tubercle.  XR Knee 1-2 Views Right  Result Date: 09/13/2022 Well-seated prosthesis without complication.    PMFS History: Patient Active Problem List   Diagnosis Date Noted   Incarcerated umbilical hernia 06/04/2022   Status post total knee replacement, right 05/08/2021   Primary osteoarthritis of right knee 05/09/2020   Past Medical History:  Diagnosis Date   Anxiety    Arthritis    Back pain    Chronic pain    Depression    Fibromyalgia    GERD (gastroesophageal reflux disease)    Hyperlipidemia    Hypertension    Neuropathy    Pneumonia     Family History  Problem Relation Age of  Onset   Brain cancer Mother    Diabetes Father     Past Surgical History:  Procedure Laterality Date   CARPAL TUNNEL RELEASE Left    MANDIBLE RECONSTRUCTION     SHOULDER SURGERY Left    TOTAL KNEE ARTHROPLASTY Right 05/08/2021   Procedure: RIGHT TOTAL KNEE ARTHROPLASTY;  Surgeon: Tarry Kos, MD;  Location: [facility] OR;  Service: Orthopedics;  Laterality: Right;   WOUND EXPLORATION Right 01/11/2016   Procedure: RIGHT WRIST EXPLORATION, RIGHT WRIST NERVE, TENDON AND ARTERY  REPAIR;  Surgeon: Dairl Ponder, MD;  Location: [facility];  Service: Orthopedics;  Laterality: Right;   Social History   Occupational History   Not on file  Tobacco Use   Smoking status: Never   Smokeless tobacco: Never   Vaping Use   Vaping Use: Never used  Substance and Sexual Activity   Alcohol use: Yes    Comment: social   Drug use: No   Sexual activity: Not on file

## 2023-02-04 ENCOUNTER — Inpatient Hospital Stay (HOSPITAL_COMMUNITY): Payer: Commercial Managed Care - HMO

## 2023-02-04 ENCOUNTER — Emergency Department (HOSPITAL_COMMUNITY): Payer: Commercial Managed Care - HMO

## 2023-02-04 ENCOUNTER — Inpatient Hospital Stay (HOSPITAL_COMMUNITY)
Admission: EM | Admit: 2023-02-04 | Discharge: 2023-02-19 | Disposition: E | Payer: Commercial Managed Care - HMO | Attending: Pulmonary Disease | Admitting: Pulmonary Disease

## 2023-02-04 DIAGNOSIS — Z7189 Other specified counseling: Secondary | ICD-10-CM

## 2023-02-04 DIAGNOSIS — R4182 Altered mental status, unspecified: Principal | ICD-10-CM

## 2023-02-04 DIAGNOSIS — I619 Nontraumatic intracerebral hemorrhage, unspecified: Secondary | ICD-10-CM | POA: Diagnosis present

## 2023-02-04 DIAGNOSIS — I613 Nontraumatic intracerebral hemorrhage in brain stem: Secondary | ICD-10-CM | POA: Diagnosis not present

## 2023-02-04 DIAGNOSIS — Z529 Donor of unspecified organ or tissue: Secondary | ICD-10-CM | POA: Diagnosis not present

## 2023-02-04 DIAGNOSIS — J9602 Acute respiratory failure with hypercapnia: Secondary | ICD-10-CM

## 2023-02-04 DIAGNOSIS — Z66 Do not resuscitate: Secondary | ICD-10-CM

## 2023-02-04 LAB — POCT I-STAT 7, (LYTES, BLD GAS, ICA,H+H)
Acid-Base Excess: 1 mmol/L (ref 0.0–2.0)
Acid-Base Excess: 3 mmol/L — ABNORMAL HIGH (ref 0.0–2.0)
Bicarbonate: 28.4 mmol/L — ABNORMAL HIGH (ref 20.0–28.0)
Bicarbonate: 27.5 mmol/L (ref 20.0–28.0)
Calcium, Ion: 1.25 mmol/L (ref 1.15–1.40)
Calcium, Ion: 1.21 mmol/L (ref 1.15–1.40)
HCT: 47 % (ref 39.0–52.0)
HCT: 46 % (ref 39.0–52.0)
Hemoglobin: 16 g/dL (ref 13.0–17.0)
Hemoglobin: 15.6 g/dL (ref 13.0–17.0)
O2 Saturation: 92 %
O2 Saturation: 100 %
Patient temperature: 39.7
Potassium: 3.7 mmol/L (ref 3.5–5.1)
Potassium: 3.6 mmol/L (ref 3.5–5.1)
Sodium: 141 mmol/L (ref 135–145)
Sodium: 141 mmol/L (ref 135–145)
TCO2: 30 mmol/L (ref 22–32)
TCO2: 29 mmol/L (ref 22–32)
pCO2 arterial: 53.3 mmHg — ABNORMAL HIGH (ref 32–48)
pCO2 arterial: 43.9 mmHg (ref 32–48)
pH, Arterial: 7.334 — ABNORMAL LOW (ref 7.35–7.45)
pH, Arterial: 7.414 (ref 7.35–7.45)
pO2, Arterial: 70 mmHg — ABNORMAL LOW (ref 83–108)
pO2, Arterial: 201 mmHg — ABNORMAL HIGH (ref 83–108)

## 2023-02-04 LAB — COMPREHENSIVE METABOLIC PANEL
ALT: 94 U/L — ABNORMAL HIGH (ref 0–44)
ALT: 93 U/L — ABNORMAL HIGH (ref 0–44)
AST: 123 U/L — ABNORMAL HIGH (ref 15–41)
AST: 147 U/L — ABNORMAL HIGH (ref 15–41)
Albumin: 4.3 g/dL (ref 3.5–5.0)
Albumin: 4 g/dL (ref 3.5–5.0)
Alkaline Phosphatase: 137 U/L — ABNORMAL HIGH (ref 38–126)
Alkaline Phosphatase: 112 U/L (ref 38–126)
Anion gap: 8 (ref 5–15)
Anion gap: 9 (ref 5–15)
BUN: 9 mg/dL (ref 6–20)
BUN: 8 mg/dL (ref 6–20)
CO2: 30 mmol/L (ref 22–32)
CO2: 26 mmol/L (ref 22–32)
Calcium: 9 mg/dL (ref 8.9–10.3)
Calcium: 9.3 mg/dL (ref 8.9–10.3)
Chloride: 101 mmol/L (ref 98–111)
Chloride: 103 mmol/L (ref 98–111)
Creatinine, Ser: 0.76 mg/dL (ref 0.61–1.24)
Creatinine, Ser: 0.99 mg/dL (ref 0.61–1.24)
GFR, Estimated: >60 mL/min (ref >60)
GFR, Estimated: >60 mL/min (ref >60)
Glucose, Bld: 161 mg/dL — ABNORMAL HIGH (ref 70–99)
Glucose, Bld: 102 mg/dL — ABNORMAL HIGH (ref 70–99)
Potassium: 4 mmol/L (ref 3.5–5.1)
Potassium: 3.4 mmol/L — ABNORMAL LOW (ref 3.5–5.1)
Sodium: 139 mmol/L (ref 135–145)
Sodium: 138 mmol/L (ref 135–145)
Total Bilirubin: 0.7 mg/dL (ref 0.3–1.2)
Total Bilirubin: 0.8 mg/dL (ref 0.3–1.2)
Total Protein: 8.4 g/dL — ABNORMAL HIGH (ref 6.5–8.1)
Total Protein: 7.4 g/dL (ref 6.5–8.1)

## 2023-02-04 LAB — CBC
HCT: 50.4 % (ref 39.0–52.0)
HCT: 44.5 % (ref 39.0–52.0)
Hemoglobin: 16.5 g/dL (ref 13.0–17.0)
Hemoglobin: 15.3 g/dL (ref 13.0–17.0)
MCH: 33 pg (ref 26.0–34.0)
MCH: 33 pg (ref 26.0–34.0)
MCHC: 32.7 g/dL (ref 30.0–36.0)
MCHC: 34.4 g/dL (ref 30.0–36.0)
MCV: 100.8 fL — ABNORMAL HIGH (ref 80.0–100.0)
MCV: 96.1 fL (ref 80.0–100.0)
Platelets: 215 10*3/uL (ref 150–400)
Platelets: 227 10*3/uL (ref 150–400)
RBC: 5 MIL/uL (ref 4.22–5.81)
RBC: 4.63 MIL/uL (ref 4.22–5.81)
RDW: 13.2 % (ref 11.5–15.5)
RDW: 13.5 % (ref 11.5–15.5)
WBC: 10.1 10*3/uL (ref 4.0–10.5)
WBC: 8.1 10*3/uL (ref 4.0–10.5)
nRBC: 0 % (ref 0.0–0.2)
nRBC: 0 % (ref 0.0–0.2)

## 2023-02-04 LAB — PROTIME-INR
INR: 1.1 (ref 0.8–1.2)
INR: 1.1 (ref 0.8–1.2)
Prothrombin Time: 14.3 s (ref 11.4–15.2)
Prothrombin Time: 14.3 s (ref 11.4–15.2)

## 2023-02-04 LAB — LACTIC ACID, PLASMA: Lactic Acid, Venous: 1.4 mmol/L (ref 0.5–1.9)

## 2023-02-04 LAB — MRSA NEXT GEN BY PCR, NASAL: MRSA by PCR Next Gen: NEGATIVE — AB

## 2023-02-04 LAB — I-STAT VENOUS BLOOD GAS, ED
Acid-base deficit: 2 mmol/L (ref 0.0–2.0)
Bicarbonate: 30.8 mmol/L — ABNORMAL HIGH (ref 20.0–28.0)
Calcium, Ion: 1.18 mmol/L (ref 1.15–1.40)
HCT: 50 % (ref 39.0–52.0)
Hemoglobin: 17 g/dL (ref 13.0–17.0)
O2 Saturation: 97 %
Potassium: 4.2 mmol/L (ref 3.5–5.1)
Sodium: 140 mmol/L (ref 135–145)
TCO2: 33 mmol/L — ABNORMAL HIGH (ref 22–32)
pCO2, Ven: 88.2 mmHg (ref 44–60)
pH, Ven: 7.15 — CL (ref 7.25–7.43)
pO2, Ven: 120 mmHg — ABNORMAL HIGH (ref 32–45)

## 2023-02-04 LAB — URINALYSIS, ROUTINE W REFLEX MICROSCOPIC
Bacteria, UA: NONE SEEN
Bilirubin Urine: NEGATIVE
Glucose, UA: 50 mg/dL — AB
Hgb urine dipstick: NEGATIVE
Ketones, ur: NEGATIVE mg/dL
Leukocytes,Ua: NEGATIVE
Nitrite: NEGATIVE
Protein, ur: 100 mg/dL — AB
Specific Gravity, Urine: 1.011 (ref 1.005–1.030)
pH: 5 (ref 5.0–8.0)

## 2023-02-04 LAB — ETHANOL: Alcohol, Ethyl (B): <10 mg/dL (ref <10)

## 2023-02-04 LAB — GLUCOSE, CAPILLARY
Glucose-Capillary: 108 mg/dL — ABNORMAL HIGH (ref 70–99)
Glucose-Capillary: 108 mg/dL — ABNORMAL HIGH (ref 70–99)
Glucose-Capillary: 105 mg/dL — ABNORMAL HIGH (ref 70–99)

## 2023-02-04 LAB — DIFFERENTIAL
Abs Immature Granulocytes: 0.04 10*3/uL (ref 0.00–0.07)
Basophils Absolute: 0.1 10*3/uL (ref 0.0–0.1)
Basophils Relative: 1 %
Eosinophils Absolute: 0.4 10*3/uL (ref 0.0–0.5)
Eosinophils Relative: 4 %
Immature Granulocytes: 0 %
Lymphocytes Relative: 53 %
Lymphs Abs: 5.4 10*3/uL — ABNORMAL HIGH (ref 0.7–4.0)
Monocytes Absolute: 1.1 10*3/uL — ABNORMAL HIGH (ref 0.1–1.0)
Monocytes Relative: 11 %
Neutro Abs: 3.1 10*3/uL (ref 1.7–7.7)
Neutrophils Relative %: 31 %

## 2023-02-04 LAB — TYPE AND SCREEN
ABO/RH(D): O POS
Antibody Screen: NEGATIVE

## 2023-02-04 LAB — I-STAT CHEM 8, ED
BUN: 11 mg/dL (ref 6–20)
Calcium, Ion: 1.14 mmol/L — ABNORMAL LOW (ref 1.15–1.40)
Chloride: 102 mmol/L (ref 98–111)
Creatinine, Ser: 0.7 mg/dL (ref 0.61–1.24)
Glucose, Bld: 162 mg/dL — ABNORMAL HIGH (ref 70–99)
HCT: 51 % (ref 39.0–52.0)
Hemoglobin: 17.3 g/dL — ABNORMAL HIGH (ref 13.0–17.0)
Potassium: 4.1 mmol/L (ref 3.5–5.1)
Sodium: 140 mmol/L (ref 135–145)
TCO2: 32 mmol/L (ref 22–32)

## 2023-02-04 LAB — BILIRUBIN, DIRECT: Bilirubin, Direct: 0.2 mg/dL (ref 0.0–0.2)

## 2023-02-04 LAB — FIBRINOGEN: Fibrinogen: 445 mg/dL (ref 210–475)

## 2023-02-04 LAB — CBG MONITORING, ED: Glucose-Capillary: 157 mg/dL — ABNORMAL HIGH (ref 70–99)

## 2023-02-04 LAB — HEMOGLOBIN A1C
Hgb A1c MFr Bld: 5 % (ref 4.8–5.6)
Mean Plasma Glucose: 96.8 mg/dL

## 2023-02-04 LAB — MAGNESIUM: Magnesium: 1.7 mg/dL (ref 1.7–2.4)

## 2023-02-04 LAB — APTT
aPTT: 30 s (ref 24–36)
aPTT: 29 s (ref 24–36)

## 2023-02-04 LAB — PHOSPHORUS: Phosphorus: 2.4 mg/dL — ABNORMAL LOW (ref 2.5–4.6)

## 2023-02-04 MED ORDER — POLYETHYLENE GLYCOL 3350 17 G PO PACK: 17.0000 g | PACK | Freq: Every day | ORAL | Status: DC

## 2023-02-04 MED ORDER — ACETAMINOPHEN 160 MG/5ML PO SOLN: 650.0000 mg | ORAL | Status: DC | PRN

## 2023-02-04 MED ORDER — STROKE: EARLY STAGES OF RECOVERY BOOK
Freq: Once | Status: AC
  Filled 2023-02-04: qty 1

## 2023-02-04 MED ORDER — FENTANYL CITRATE PF 50 MCG/ML IJ SOSY: 50.0000 ug | PREFILLED_SYRINGE | INTRAMUSCULAR | Status: DC | PRN

## 2023-02-04 MED ORDER — ACETAMINOPHEN 325 MG PO TABS: 650.0000 mg | ORAL_TABLET | ORAL | Status: DC | PRN

## 2023-02-04 MED ORDER — CLEVIDIPINE BUTYRATE 0.5 MG/ML IV EMUL: 0.0000 mg/h | INTRAVENOUS | Status: DC

## 2023-02-04 MED ORDER — CLEVIDIPINE BUTYRATE 0.5 MG/ML IV EMUL
INTRAVENOUS | Status: AC
  Administered 2023-02-04: 2 mg/h via INTRAVENOUS
  Filled 2023-02-04: qty 100

## 2023-02-04 MED ORDER — SODIUM CHLORIDE 0.9% FLUSH
3.0000 mL | Freq: Once | INTRAVENOUS | Status: AC
  Administered 2023-02-04: 3 mL via INTRAVENOUS

## 2023-02-04 MED ORDER — ORAL CARE MOUTH RINSE: 15.0000 mL | OROMUCOSAL | Status: DC | PRN

## 2023-02-04 MED ORDER — SODIUM CHLORIDE 0.9 % IV SOLN: INTRAVENOUS | Status: DC | PRN

## 2023-02-04 MED ORDER — PANTOPRAZOLE SODIUM 40 MG IV SOLR
40.0000 mg | Freq: Every day | INTRAVENOUS | Status: DC
  Administered 2023-02-04 – 2023-02-05 (×2): 40 mg via INTRAVENOUS
  Filled 2023-02-04 (×2): qty 10

## 2023-02-04 MED ORDER — ETOMIDATE 2 MG/ML IV SOLN
INTRAVENOUS | Status: AC | PRN
Start: 2023-02-04 — End: 2023-02-04
  Administered 2023-02-04: 20 mg via INTRAVENOUS

## 2023-02-04 MED ORDER — ORAL CARE MOUTH RINSE
15.0000 mL | OROMUCOSAL | Status: DC
  Administered 2023-02-04 – 2023-02-06 (×22): 15 mL via OROMUCOSAL

## 2023-02-04 MED ORDER — LABETALOL HCL 5 MG/ML IV SOLN
INTRAVENOUS | Status: AC
  Filled 2023-02-04: qty 4

## 2023-02-04 MED ORDER — SUCCINYLCHOLINE CHLORIDE 200 MG/10ML IV SOSY: 120.0000 mg | PREFILLED_SYRINGE | Freq: Once | INTRAVENOUS | Status: DC

## 2023-02-04 MED ORDER — ACETAMINOPHEN 650 MG RE SUPP
650.0000 mg | RECTAL | Status: DC | PRN
  Administered 2023-02-04 – 2023-02-06 (×6): 650 mg via RECTAL
  Filled 2023-02-04 (×6): qty 1

## 2023-02-04 MED ORDER — LACTATED RINGERS IV BOLUS
500.0000 mL | Freq: Once | INTRAVENOUS | Status: AC
  Administered 2023-02-05: 500 mL via INTRAVENOUS

## 2023-02-04 MED ORDER — ETOMIDATE 2 MG/ML IV SOLN: 20.0000 mg | Freq: Once | INTRAVENOUS | Status: DC

## 2023-02-04 MED ORDER — FAMOTIDINE 20 MG PO TABS: 20.0000 mg | ORAL_TABLET | Freq: Two times a day (BID) | ORAL | Status: DC

## 2023-02-04 MED ORDER — PROPOFOL 1000 MG/100ML IV EMUL
0.0000 ug/kg/min | INTRAVENOUS | Status: DC
  Administered 2023-02-04 – 2023-02-05 (×3): 10 ug/kg/min via INTRAVENOUS
  Administered 2023-02-06: 20 ug/kg/min via INTRAVENOUS
  Administered 2023-02-06: 10 ug/kg/min via INTRAVENOUS
  Filled 2023-02-04 (×5): qty 100

## 2023-02-04 MED ORDER — MIDAZOLAM-SODIUM CHLORIDE 100-0.9 MG/100ML-% IV SOLN
0.5000 mg/h | INTRAVENOUS | Status: DC
  Administered 2023-02-04: 0.5 mg/h via INTRAVENOUS
  Administered 2023-02-06: 5 mg/h via INTRAVENOUS
  Filled 2023-02-04 (×2): qty 100

## 2023-02-04 MED ORDER — SODIUM CHLORIDE 0.45 % IV SOLN: INTRAVENOUS | Status: DC

## 2023-02-04 MED ORDER — FENTANYL BOLUS VIA INFUSION
25.0000 ug | INTRAVENOUS | Status: DC | PRN
  Administered 2023-02-04: 100 ug via INTRAVENOUS
  Administered 2023-02-04: 75 ug via INTRAVENOUS
  Administered 2023-02-04: 50 ug via INTRAVENOUS
  Administered 2023-02-04 – 2023-02-06 (×4): 75 ug via INTRAVENOUS

## 2023-02-04 MED ORDER — SUCCINYLCHOLINE CHLORIDE 20 MG/ML IJ SOLN
INTRAMUSCULAR | Status: AC | PRN
  Administered 2023-02-04: 120 mg via INTRAVENOUS

## 2023-02-04 MED ORDER — CLEVIDIPINE BUTYRATE 0.5 MG/ML IV EMUL
0.0000 mg/h | INTRAVENOUS | Status: DC
  Administered 2023-02-04: 4 mg/h via INTRAVENOUS
  Administered 2023-02-04: 2 mg/h via INTRAVENOUS
  Administered 2023-02-05: 8 mg/h via INTRAVENOUS
  Administered 2023-02-05: 2 mg/h via INTRAVENOUS
  Administered 2023-02-05: 8 mg/h via INTRAVENOUS
  Administered 2023-02-06: 12 mg/h via INTRAVENOUS
  Administered 2023-02-06: 10 mg/h via INTRAVENOUS
  Filled 2023-02-04 (×4): qty 100
  Filled 2023-02-04 (×2): qty 50
  Filled 2023-02-04 (×3): qty 100

## 2023-02-04 MED ORDER — FENTANYL 2500MCG IN NS 250ML (10MCG/ML) PREMIX INFUSION
0.0000 ug/h | INTRAVENOUS | Status: DC
  Administered 2023-02-04: 25 ug/h via INTRAVENOUS
  Administered 2023-02-05: 75 ug/h via INTRAVENOUS
  Administered 2023-02-06: 200 ug/h via INTRAVENOUS
  Filled 2023-02-04 (×3): qty 250

## 2023-02-04 MED ORDER — FENTANYL CITRATE PF 50 MCG/ML IJ SOSY: 25.0000 ug | PREFILLED_SYRINGE | Freq: Once | INTRAMUSCULAR | Status: DC

## 2023-02-04 MED ORDER — SENNOSIDES-DOCUSATE SODIUM 8.6-50 MG PO TABS: 1.0000 | ORAL_TABLET | Freq: Two times a day (BID) | ORAL | Status: DC

## 2023-02-04 MED ORDER — PROPOFOL 1000 MG/100ML IV EMUL
INTRAVENOUS | Status: AC
  Administered 2023-02-04: 30 ug/kg/min via INTRAVENOUS
  Filled 2023-02-04: qty 100

## 2023-02-04 NOTE — Progress Notes (Addendum)
Family has indicated a desire to move to comfort only measures, [employer] has approached the family and the patient is a candidate for some type of donation.  Currently the patient is continuing to be supported as per [employer] and family wishes.  Ritta Slot, MD [facility] [phone]  If 7pm- 7am, please page neurology on call as listed in [facility].

## 2023-02-04 NOTE — Code Documentation (Addendum)
Stroke Response Nurse Documentation Code Documentation  DELEON SEMRAD is a 62 y.o. male arriving to [facility]  via [facility] on 02/02/2023 with past medical hx of HTN, HLD. On No antithrombotic. Code stroke was activated by EMS.   Patient from home where he was LKW at 0230. Per wife, they were watching a movie   Stroke team at the bedside on patient arrival. Labs drawn and patient cleared for CT by Dr. Rodena Medin. Patient to CT with team. NIHSS 29, see documentation for details and code stroke times. Patient with decreased LOC, disoriented, not following commands, bilateral hemianopia, bilateral arm weakness, bilateral leg weakness, Global aphasia , and dysarthria  on exam. The following imaging was completed:  CT Head. Patient is not a candidate for IV Thrombolytic due to LKW outside of window per MD . Patient is not a candidate for IR due to hemorrhage per MD.   Care Plan: VS/NIHSS/Pupil checks: q1hr; SBP Goal 130-150.   Bedside handoff with ED RN Katrina.    Felecia Jan  Stroke Response RN

## 2023-02-04 NOTE — Procedures (Signed)
Arterial Catheter Insertion Procedure Note  Hosteen Fudala [facility]  [id number]  14-Jan-1961  Date:02/14/2023  Time:5:08 PM    Provider Performing: Vicente Masson    Procedure: Insertion of Arterial Line (40981) without US guidance  Indication(s) Blood pressure monitoring and/or need for frequent ABGs  Consent Risks of the procedure as well as the alternatives and risks of each were explained to the patient and/or caregiver.  Consent for the procedure was obtained and is signed in the bedside chart  Anesthesia None   Time Out Verified patient identification, verified procedure, site/side was marked, verified correct patient position, special equipment/implants available, medications/allergies/relevant history reviewed, required imaging and test results available.   Sterile Technique Maximal sterile technique including full sterile barrier drape, hand hygiene, sterile gown, sterile gloves, mask, hair covering, sterile ultrasound probe cover (if used).   Procedure Description Area of catheter insertion was cleaned with chlorhexidine and draped in sterile fashion. Without real-time ultrasound guidance an arterial catheter was placed into the right radial artery.  Appropriate arterial tracings confirmed on monitor.     Complications/Tolerance None; patient tolerated the procedure well.   EBL Minimal   Specimen(s) None

## 2023-02-04 NOTE — Progress Notes (Signed)
Neuro checks are q1hr per stroke RN Meryl Dare

## 2023-02-04 NOTE — ED Provider Notes (Signed)
[place] [facility] AT [facility] Provider Note   CSN: [id number] Arrival date & time: 02/11/2023  7253     History  Chief Complaint  Patient presents with   Code Stroke    Eugene Hardin is a 62 y.o. male.  62 year old male with prior medical history as detailed below presents for evaluation.  Patient found at home by his wife.  He was discovered on the floor of his bedroom after his wife heard him roll out of the bed at approximately 7:30AM.  He was last known normal at 2:30 AM.  EMS reports the patient was hypoxic with impaired respiratory drive on their initial evaluation.  EMS reports that patient was minimally responsive to pain and still had intact gag reflex.  2 mg of Narcan given intra nasally without improvement in symptoms.  On arrival to the ED the patient is obtunded with impaired respiratory drive.  BVM assisted ventilations are in progress.  Patient is otherwise unresponsive to both verbal and painful stimulation.  Patient required emergent intubation to protect airway.  The history is provided by the patient, medical records and the EMS personnel.       Home Medications Prior to Admission medications   Medication Sig Start Date End Date Taking? Authorizing Provider  ALPRAZolam Prudy Feeler) 1 MG tablet Take 1 mg by mouth at bedtime.    [provider]  amLODipine (NORVASC) 5 MG tablet Take 5 mg by mouth daily. 11/11/19   [provider]  ascorbic acid (VITAMIN C) 500 MG tablet Take 500 mg by mouth daily.    [provider]  aspirin EC 81 MG tablet Take 1 tablet (81 mg total) by mouth at bedtime. To be taken after surgery to prevent blood clots 06/03/22   Pappayliou, Santina Evans A, DO  atorvastatin (LIPITOR) 40 MG tablet Take 40 mg by mouth every evening. 09/23/19   [provider]  azelastine (ASTELIN) 0.1 % nasal spray Place 2 sprays into both nostrils 2 (two) times daily as needed for allergies. 09/18/19   [provider]  B Complex-C (B-COMPLEX WITH VITAMIN C) tablet Take 1 tablet by mouth daily.    [provider]  Cyanocobalamin (VITAMIN B-12) 2500 MCG SUBL Take 2,500 mcg by mouth daily.    [provider]  FLUoxetine (PROZAC) 10 MG capsule Take 10 mg by mouth 2 (two) times daily. 09/23/19   [provider]  gabapentin (NEURONTIN) 400 MG capsule Take 400 mg by mouth at bedtime.    [provider]  HYDROcodone-acetaminophen (NORCO) 10-325 MG tablet Take 1-2 tablets by mouth daily as needed for moderate pain or severe pain. 05/10/21   Tarry Kos, MD  HYDROcodone-acetaminophen (NORCO/VICODIN) 5-325 MG tablet Take 1 tablet by mouth every 6 (six) hours as needed for moderate pain. 05/22/22   [provider]  loratadine-pseudoephedrine (CLARITIN-D 24-HOUR) 10-240 MG 24 hr tablet Take 1 tablet by mouth daily.    [provider]  metoCLOPramide (REGLAN) 10 MG tablet Take 10 mg by mouth 4 (four) times daily as needed (hiccups). 11/07/20   [provider]  Naphazoline HCl (CLEAR EYES OP) Place 1 drop into both eyes daily as needed (irritation).    [provider]  omeprazole (PRILOSEC) 20 MG capsule Take 40 mg by mouth daily before breakfast. 11/12/19   [provider]  vitamin E 180 MG (400 UNITS) capsule Take 400 Units by mouth daily.    [provider]  Allergies    Tramadol, Citalopram hydrobromide, Duloxetine hcl, Lisinopril-hydrochlorothiazide, Sertraline hcl, Venlafaxine, Vinegar [acetic acid], and Septra [sulfamethoxazole-trimethoprim]    Review of Systems   Review of Systems  Unable to perform ROS: Acuity of condition    Physical Exam Updated Vital Signs BP (!) 221/92   Pulse 75   Resp (!) 24   SpO2 92%  Physical Exam Vitals and nursing note reviewed.  Constitutional:      General: He is in acute distress.     Appearance: He is well-developed.     Comments: Obtunded, BVM assisted ventilations in  progress, unresponsive  No withdrawal to painful stimulation in both upper extremities.  Minimal withdrawal to painful stimulation in both lower distal extremities.  Pupils are sluggishly reactive bilaterally and equal at approximately 4mm  HENT:     Head: Normocephalic and atraumatic.  Eyes:     Conjunctiva/sclera: Conjunctivae normal.  Cardiovascular:     Rate and Rhythm: Normal rate and regular rhythm.     Heart sounds: Normal heart sounds.  Pulmonary:     Effort: Pulmonary effort is normal. No respiratory distress.     Breath sounds: Normal breath sounds.  Abdominal:     General: There is no distension.     Palpations: Abdomen is soft.     Tenderness: There is no abdominal tenderness.  Musculoskeletal:        General: No deformity. Normal range of motion.     Cervical back: Normal range of motion.  Skin:    General: Skin is warm and dry.     ED Results / Procedures / Treatments   Labs (all labs ordered are listed, but only abnormal results are displayed) Labs Reviewed  I-STAT CHEM 8, ED - Abnormal; Notable for the following components:      Result Value   Glucose, Bld 162 (*)    Calcium, Ion 1.14 (*)    Hemoglobin 17.3 (*)    All other components within normal limits  CBG MONITORING, ED - Abnormal; Notable for the following components:   Glucose-Capillary 157 (*)    All other components within normal limits  I-STAT VENOUS BLOOD GAS, ED - Abnormal; Notable for the following components:   pH, Ven 7.150 (*)    pCO2, Ven 88.2 (*)    pO2, Ven 120 (*)    Bicarbonate 30.8 (*)    TCO2 33 (*)    All other components within normal limits  PROTIME-INR  APTT  CBC  DIFFERENTIAL  COMPREHENSIVE METABOLIC PANEL  ETHANOL  BLOOD GAS, VENOUS    EKG None  Radiology No results found.  Procedures Procedure Name: Intubation Date/Time: 02/23/23 8:44 AM  Performed by: Wynetta Fines, MDPre-anesthesia Checklist: Patient identified, Patient being monitored, Emergency  Drugs available, Timeout performed and Suction available Oxygen Delivery Method: Ambu bag Preoxygenation: Pre-oxygenation with 100% oxygen Induction Type: Rapid sequence Ventilation: Mask ventilation without difficulty Laryngoscope Size: Glidescope and 4 Grade View: Grade I Tube size: 7.5 mm Number of attempts: 1 Placement Confirmation: ETT inserted through vocal cords under direct vision, CO2 detector and Breath sounds checked- equal and bilateral Secured at: 24 cm Tube secured with: ETT holder        Medications Ordered in ED Medications  sodium chloride flush (NS) 0.9 % injection 3 mL (has no administration in time range)  etomidate (AMIDATE) injection 20 mg (has no administration in time range)  succinylcholine (ANECTINE) syringe 120 mg (has no administration in time range)  propofol (DIPRIVAN) 1000 MG/100ML infusion (  has no administration in time range)  clevidipine (CLEVIPREX) 0.5 MG/ML infusion (has no administration in time range)  labetalol (NORMODYNE) 5 MG/ML injection (has no administration in time range)  etomidate (AMIDATE) injection (20 mg Intravenous Given 01/30/2023 0828)  succinylcholine (ANECTINE) injection (120 mg Intravenous Given 01/27/2023 0347)    ED Course/ Medical Decision Making/ A&P                                 Medical Decision Making Amount and/or Complexity of Data Reviewed Labs: ordered. Radiology: ordered.  Risk Prescription drug management. Decision regarding hospitalization.    Medical Screen Complete  This patient presented to the ED with complaint of AMS.  This complaint involves an extensive number of treatment options. The initial differential diagnosis includes, but is not limited to, ischemic CVA, intracranial hemorrhage, metabolic abnormality, etc.  This presentation is: Acute, Self-Limited, Previously Undiagnosed, Uncertain Prognosis, Complicated, Systemic Symptoms, and Threat to Life/Bodily Function  Patient is presenting  with AMS and significant obtundation.  Patient with impaired respiratory drive and required intubation on arrival.  Neuro team present at time of patient's arrival to the ED.  CT imaging reveals evidence of large pons hemorrhage.  Dr. Amada Jupiter with neurology to admit patient.  Dr. Amada Jupiter informed patient's family of patient's dismal prognosis.  Additional history obtained:  Additional history obtained from EMS External records from outside sources obtained and reviewed including prior ED visits and prior Inpatient records.    Lab Tests:  I ordered and personally interpreted labs.  The pertinent results include: CBC, CMP, INR, EtOH, VBG, i-STAT Chem-8   Imaging Studies ordered:  I ordered imaging studies including chest x-ray, CT head I independently visualized and interpreted obtained imaging which showed large hemorrhage in the pons I agree with the radiologist interpretation.   Cardiac Monitoring:  The patient was maintained on a cardiac monitor.  I personally viewed and interpreted the cardiac monitor which showed an underlying rhythm of: NSR   Medicines ordered:  I ordered medication including RSI medications for intubation Reevaluation of the patient after these medicines showed that the patient: stayed the same  Problem List / ED Course:  AMS, intracranial hemorrhage   Reevaluation:  After the interventions noted above, I reevaluated the patient and found that they have: stayed the same   Disposition:  After consideration of the diagnostic results and the patients response to treatment, I feel that the patent would benefit from admission.   CRITICAL CARE Performed by: Wynetta Fines   Total critical care time: 30 minutes  Critical care time was exclusive of separately billable procedures and treating other patients.  Critical care was necessary to treat or prevent imminent or life-threatening deterioration.  Critical care was time spent  personally by me on the following activities: development of treatment plan with patient and/or surrogate as well as nursing, discussions with consultants, evaluation of patient's response to treatment, examination of patient, obtaining history from patient or surrogate, ordering and performing treatments and interventions, ordering and review of laboratory studies, ordering and review of radiographic studies, pulse oximetry and re-evaluation of patient's condition.         Final Clinical Impression(s) / ED Diagnoses Final diagnoses:  Altered mental status, unspecified altered mental status type    Rx / DC Orders ED Discharge Orders     None         Wynetta Fines, MD 02/18/2023 [phone]

## 2023-02-04 NOTE — Progress Notes (Signed)
Bedside Bronchoscopy performed with Dr. Everardo All, patient remained stable during procedure.

## 2023-02-04 NOTE — ED Triage Notes (Addendum)
Found down by wife at 71AM. Unresponsive since then. Narcan 2mg  IN given en route. +gag and pt is responsive to pain. Code stroke on arrival. Wife reports they were watching a movie at 0230AM.

## 2023-02-04 NOTE — ED Notes (Signed)
Xray has been called twice by this RN. Waiting for OGT and ETT placement confirmation by xray.

## 2023-02-04 NOTE — Procedures (Signed)
Bronchoscopy Procedure Note   Eugene Hardin  [id number]  1961/01/05  Date:01/25/2023  Time:9:32 PM    Provider Performing:Edilia Ghuman Mechele Collin, MD   Procedure(s):  Flexible bronchoscopy with bronchial alveolar lavage [phone])   Indication(s) Deceased donor bronch   Consent Obtained under donor consent   Anesthesia Fentanyl     Time Out Verified patient identification, verified procedure, site/side was marked, verified correct patient position, special equipment/implants available, medications/allergies/relevant history reviewed, required imaging and test results available.     Sterile Technique Usual hand hygiene, masks, gowns, and gloves were used     Procedure Description Bronchoscope advanced through endotracheal tube and into airway.  Airways were examined down to subsegmental level with findings noted below.   Following diagnostic evaluation, BAL(s) performed in LUL and RUL with normal saline and return of 15 cc blood tinged and 20 cc cloudy blood tinged fluid, respectively   Findings:  S/p BAL of LUL, sent to [facility] for analysis S/p BAL of RUL, sent to [facility] lab for cell count and culture   Complications/Tolerance None; patient tolerated the procedure well. Chest X-ray is not needed post procedure.     EBL Minimal     Specimen(s) As above

## 2023-02-04 NOTE — H&P (Signed)
Neurology History and physical  Reason for Consult: unresponsiveness Referring Physician: Rodena Medin,   CC: unresponsiveness  History is obtained from:EMS  HPI: Eugene Hardin is a 62 y.o. male who was found down this morning.  His wife heard him roll over in bed and then fall and hit the floor.  He initially was trying to speak, but was garbled.  On arrival to the emergency department, he was actively being bagged due to hypoventilation, Narcan had been attempted in the field and was unsuccessful.  He was intubated emergently in the ER and taken for CAT scan which demonstrated a massive brainstem hemorrhage.   LKW: 2:30 am tnk given?: no, out of window Premorbid modified rankin scale: 0 ICH Score: 4  Past Medical History:  Diagnosis Date   Anxiety    Arthritis    Back pain    Chronic pain    Depression    Fibromyalgia    GERD (gastroesophageal reflux disease)    Hyperlipidemia    Hypertension    Neuropathy    Pneumonia      Family History  Problem Relation Age of Onset   Brain cancer Mother    Diabetes Father      Social History:  reports that he has never smoked. He has never used smokeless tobacco. He reports current alcohol use. He reports that he does not use drugs.   Exam: Current vital signs: There were no vitals taken for this visit. Vital signs in last 24 hours:     Physical Exam  Appears well-developed and well-nourished.   Neuro: Mental Status: Patient is comatose, does not follow commands Cranial Nerves: II: Does not blink to thre. Pupils are 2-75mm, equal, round, and sluggish but reactive to light.   III,IV, VI: eyes are midline and conjugate V: VII: corneals are absent Motor: Tone is flaccid, no response to nail bed pressure in bilateral upper extremities, withdrawal vs flexion in bilateral lower extremities Sensory: As above Plantars: Toes are downgoing bilaterally.  Cerebellar: Does not perform   I have reviewed labs in epic and the  results pertinent to this consultation are: Cbg 157  I have reviewed the images obtained: CT head-massive brainstem hemorrhage  Impression: 62 yo M found down and unresponsive this AM and has been found to have massive brainstem hemorrhage.  This is not a survivable injury, and I have discussed this with the family.  They are understanding, he has family locally who they feel would like to visit before making the decision to extubate.  I think that this is reasonable and he will be admitted to the intensive care unit for supportive care.  Given that this is not a survivable injury, I do not think resuscitation in the setting of cardiac arrest would be at all beneficial, and I discussed this with the wife who expresses understanding.  Recommendations: 1) Admit to ICU 2) no antiplatelets or anticoagulants 3) blood pressure control with goal systolic 130 - 150 4) continue to counsel family towards withdrawal of care. 5) appreciate CCM assistance.  This patient is critically ill and at significant risk of neurological worsening, death and care requires constant monitoring of vital signs, hemodynamics,respiratory and cardiac monitoring, neurological assessment, discussion with family, other specialists and medical decision making of high complexity. I spent 65 minutes of neurocritical care time  in the care of  this patient. This was time spent independent of any time provided by nurse practitioner or PA.  Ritta Slot, MD [facility] [phone]  If 7pm- 7am, please page neurology on call as listed in AMION. February 21, 2023  9:13 AM

## 2023-02-04 NOTE — Progress Notes (Signed)
PT Cancellation Note  Patient Details Name: Eugene Hardin MRN: [id number] DOB: [birth year]   Cancelled Treatment:    Reason Eval/Treat Not Completed: Active bedrest order   Enedina Finner Tarhonda Hollenberg 02/09/2023, 11:04 AM Merryl Hacker, PT Acute Rehabilitation Services Office: [phone]

## 2023-02-04 NOTE — Progress Notes (Addendum)
eLink Physician-Brief Progress Note Patient Name: Eugene Hardin DOB: May 25, 1960 MRN: [id number]   Date of Service  02/05/2023  HPI/Events of Note  HB coordinator asking if you will place the following orders:  -q6h CBC (first now, to go along w/other timed labs due now) -urine cx to be collected w/morning UA -covid PCR -resp cx & gram stain s/p bronch BAL  Discussion already made by bedside team with family regarding organ donation  eICU Interventions  Above requests ordered     Intervention Category Intermediate Interventions: Other:  Darl Pikes 02/01/2023, 11:17 PM

## 2023-02-04 NOTE — Consult Note (Addendum)
NAME:  Eugene Hardin, MRN:  [id number], DOB:  [birth year], LOS: 0 ADMISSION DATE:  02/05/2023, CONSULTATION DATE:  01/26/2023 REFERRING MD:  Amada Jupiter, CHIEF COMPLAINT:  AMS // endotracheally intubated    History of Present Illness:  62 yo M pmh HTN HLD who presented to ED 9/16 after being found down by his wife. Pt seemed to have rolled out of bed, hitting the floor. His speech was reportedly garbled, EMS was dispatched. En route his mental status declined and he required BVM. He was intubated in ED, taken for CT H which unfortunately revealed a devastating brainstem hemorrhage.   Admitted to neuro, who has spoken to family about this non-survivable bleed. His code status is subsequently updated to DNR, with expected transition to comfort care when resp of family has gathered    PCCM is consulted in this setting   Pertinent  Medical History  HTN HLD GERD Chronic pain Fibromyalgia Depression  Arthritis   Significant Hospital Events: Including procedures, antibiotic start and stop dates in addition to other pertinent events   02/10/2023 admitted for supportive care off non-survivable brainstem hemorrhage   Interim History / Subjective:  Code status recently updated to DNR   Objective   Blood pressure (!) 105/58, pulse 82, temperature 97.7 F (36.5 C), resp. rate (!) 24, height 5\' 5"  (1.651 m), weight 120 kg, SpO2 96%.    Vent Mode: PRVC FiO2 (%):  [100 %] 100 % Set Rate:  [20 bmp] 20 bmp Vt Set:  [490 mL] 490 mL PEEP:  [5 cmH20] 5 cmH20  No intake or output data in the 24 hours ending 02/07/2023 0951 Filed Weights   02/09/2023 0829  Weight: 120 kg    Examination: General: critically ill obese adult M intubated NAD HENT: NCAT pink mm anicteric sclera ETT secure  Lungs: mechanically ventilated  Cardiovascular: tachycardic  Abdomen: obese soft  Extremities: no acute joint deformity.  Neuro: On low dose prop. No spontaneous resp effort. No response to pain  GU: foley    Resolved Hospital Problem list     Assessment & Plan:   DNR status  Goals of care discussion // encounter for palliative care  Large Brainstem hemorrhage  Acute respiratory failure with hypercarbia HTN  Hx HLD Hx GERD Hx chronic pain  P -will be admitted to ICU  -DNR  -supportive care measures as follows while family gathers  -incr PEEP and RR for hypercarbic, decr FiO2  -VAP, pulm hygiene  -no chemical dvt ppx or antiplt  -I have spoken with family and recommended transition to comfort care when everyone has arrived & we get to an ICU bed. They understand this is terminal regardless of intervention.  -We will liberalize comfort based medications now, and further titrate following extubation. After talking with family, we will respectfully decline following typical BP goals given the terminal nature of this situation. Similarly, we will not follow any further labs and there is not utility to following stroke scale. We will try to make the room/pt as calm and comfortable as feasible, family is hoping to not be interrupted much. We talked about what the transition to comfort entails and they are understanding of this. Revisit timing after he gets to the unit     Best Practice (right click and "Reselect all SmartList Selections" daily)  Per primary   Labs   CBC: Recent Labs  Lab 02/12/2023 0821 01/21/2023 0829 02/15/2023 0830  WBC 10.1  --   --   NEUTROABS 3.1  --   --  HGB 16.5 17.3* 17.0  HCT 50.4 51.0 50.0  MCV 100.8*  --   --   PLT 215  --   --     Basic Metabolic Panel: Recent Labs  Lab 01/24/2023 0821 01/25/2023 0829 02/09/2023 0830  NA 139 140 140  K 4.0 4.1 4.2  CL 101 102  --   CO2 30  --   --   GLUCOSE 161* 162*  --   BUN 9 11  --   CREATININE 0.76 0.70  --   CALCIUM 9.0  --   --    GFR: Estimated Creatinine Clearance: 117.9 mL/min (by C-G formula based on SCr of 0.7 mg/dL). Recent Labs  Lab 02/18/2023 0821  WBC 10.1    Liver Function Tests: Recent Labs   Lab 01/24/2023 0821  AST 123*  ALT 94*  ALKPHOS 137*  BILITOT 0.7  PROT 8.4*  ALBUMIN 4.3   No results for input(s): "LIPASE", "AMYLASE" in the last 168 hours. No results for input(s): "AMMONIA" in the last 168 hours.  ABG    Component Value Date/Time   HCO3 30.8 (H) 02/11/2023 0830   TCO2 33 (H) 02/12/2023 0830   ACIDBASEDEF 2.0 02/03/2023 0830   O2SAT 97 02/18/2023 0830     Coagulation Profile: Recent Labs  Lab 02/03/2023 0821  INR 1.1    Cardiac Enzymes: No results for input(s): "CKTOTAL", "CKMB", "CKMBINDEX", "TROPONINI" in the last 168 hours.  HbA1C: No results found for: "HGBA1C"  CBG: Recent Labs  Lab 02/11/2023 0824  GLUCAP 157*    Review of Systems:   Unable to obtain   Past Medical History:  He,  has a past medical history of Anxiety, Arthritis, Back pain, Chronic pain, Depression, Fibromyalgia, GERD (gastroesophageal reflux disease), Hyperlipidemia, Hypertension, Neuropathy, and Pneumonia.   Surgical History:   Past Surgical History:  Procedure Laterality Date   CARPAL TUNNEL RELEASE Left    MANDIBLE RECONSTRUCTION     SHOULDER SURGERY Left    TOTAL KNEE ARTHROPLASTY Right 05/08/2021   Procedure: RIGHT TOTAL KNEE ARTHROPLASTY;  Surgeon: Tarry Kos, MD;  Location: [facility] OR;  Service: Orthopedics;  Laterality: Right;   WOUND EXPLORATION Right 01/11/2016   Procedure: RIGHT WRIST EXPLORATION, RIGHT WRIST NERVE, TENDON AND ARTERY  REPAIR;  Surgeon: Dairl Ponder, MD;  Location: [facility];  Service: Orthopedics;  Laterality: Right;     Social History:   reports that he has never smoked. He has never used smokeless tobacco. He reports current alcohol use. He reports that he does not use drugs.   Family History:  His family history includes Brain cancer in his mother; Diabetes in his father.   Allergies Allergies  Allergen Reactions   Tramadol Anaphylaxis   Citalopram Hydrobromide     Sleepy   Duloxetine Hcl     Sleepy    Lisinopril-Hydrochlorothiazide     irritability   Sertraline Hcl     Sleepy   Venlafaxine     GI upset   Vinegar [Acetic Acid]     hiccups   Septra [Sulfamethoxazole-Trimethoprim] Rash     Home Medications  Prior to Admission medications   Medication Sig Start Date End Date Taking? Authorizing Provider  ALPRAZolam Prudy Feeler) 1 MG tablet Take 1 mg by mouth at bedtime.    [provider]  amLODipine (NORVASC) 5 MG tablet Take 5 mg by mouth daily. 11/11/19   [provider]  ascorbic acid (VITAMIN C) 500 MG tablet Take 500 mg by mouth daily.  [provider]  aspirin EC 81 MG tablet Take 1 tablet (81 mg total) by mouth at bedtime. To be taken after surgery to prevent blood clots 06/03/22   Pappayliou, Santina Evans A, DO  atorvastatin (LIPITOR) 40 MG tablet Take 40 mg by mouth every evening. 09/23/19   [provider]  azelastine (ASTELIN) 0.1 % nasal spray Place 2 sprays into both nostrils 2 (two) times daily as needed for allergies. 09/18/19   [provider]  B Complex-C (B-COMPLEX WITH VITAMIN C) tablet Take 1 tablet by mouth daily.    [provider]  Cyanocobalamin (VITAMIN B-12) 2500 MCG SUBL Take 2,500 mcg by mouth daily.    [provider]  FLUoxetine (PROZAC) 10 MG capsule Take 10 mg by mouth 2 (two) times daily. 09/23/19   [provider]  gabapentin (NEURONTIN) 400 MG capsule Take 400 mg by mouth at bedtime.    [provider]  HYDROcodone-acetaminophen (NORCO) 10-325 MG tablet Take 1-2 tablets by mouth daily as needed for moderate pain or severe pain. 05/10/21   Tarry Kos, MD  HYDROcodone-acetaminophen (NORCO/VICODIN) 5-325 MG tablet Take 1 tablet by mouth every 6 (six) hours as needed for moderate pain. 05/22/22   [provider]  loratadine-pseudoephedrine (CLARITIN-D 24-HOUR) 10-240 MG 24 hr tablet Take 1 tablet by mouth daily.    [provider]  metoCLOPramide (REGLAN) 10 MG tablet Take  10 mg by mouth 4 (four) times daily as needed (hiccups). 11/07/20   [provider]  Naphazoline HCl (CLEAR EYES OP) Place 1 drop into both eyes daily as needed (irritation).    [provider]  omeprazole (PRILOSEC) 20 MG capsule Take 40 mg by mouth daily before breakfast. 11/12/19   [provider]  vitamin E 180 MG (400 UNITS) capsule Take 400 Units by mouth daily.    [provider]     Critical care time: 40 min     CRITICAL CARE Performed by: Lanier Clam  Total critical care time: 40 minutes  Critical care time was exclusive of separately billable procedures and treating other patients. Critical care was necessary to treat or prevent imminent or life-threatening deterioration.  Critical care was time spent personally by me on the following activities: development of treatment plan with patient and/or surrogate as well as nursing, discussions with consultants, evaluation of patient's response to treatment, examination of patient, obtaining history from patient or surrogate, ordering and performing treatments and interventions, ordering and review of laboratory studies, ordering and review of radiographic studies, pulse oximetry and re-evaluation of patient's condition.  Tessie Fass MSN, AGACNP-BC [facility] Pulmonary/Critical Care Medicine Amion for pager 18-Feb-2023, 9:51 AM

## 2023-02-04 NOTE — Progress Notes (Signed)
02-18-23 1729  Adult Ventilator Settings  I Time (S)  1.15 Sec(s) (Per honor bridge to obtain a 1:1 I:E ratio)  PEEP (S)  8 cmH20

## 2023-02-04 NOTE — Progress Notes (Signed)
eLink Physician-Brief Progress Note Patient Name: Eugene Hardin DOB: April 20, 1961 MRN: [id number]   Date of Service  01/27/2023  HPI/Events of Note  RN reports pt has low UOP, 60 ml this shift.  Has 1/2 NS @ 75 ml/hr infusing.  No central line for CVP  122/57 (72) art line, HR 90, 96%  Negative fluid balance  eICU Interventions  Ordered a trial of 500 cc LR bolus     Intervention Category Intermediate Interventions: Oliguria - evaluation and management  Darl Pikes 02/16/2023, 11:54 PM

## 2023-02-04 NOTE — Progress Notes (Signed)
Transported patient to C.T while patient was on the ventilator. Patient remained stable during transport.

## 2023-02-05 DIAGNOSIS — J9602 Acute respiratory failure with hypercapnia: Secondary | ICD-10-CM | POA: Diagnosis not present

## 2023-02-05 LAB — POCT I-STAT 7, (LYTES, BLD GAS, ICA,H+H)
Acid-Base Excess: 2 mmol/L (ref 0.0–2.0)
Acid-Base Excess: 0 mmol/L (ref 0.0–2.0)
Acid-Base Excess: 1 mmol/L (ref 0.0–2.0)
Bicarbonate: 26.7 mmol/L (ref 20.0–28.0)
Bicarbonate: 25.8 mmol/L (ref 20.0–28.0)
Bicarbonate: 25.1 mmol/L (ref 20.0–28.0)
Calcium, Ion: 1.19 mmol/L (ref 1.15–1.40)
Calcium, Ion: 1.22 mmol/L (ref 1.15–1.40)
Calcium, Ion: 1.17 mmol/L (ref 1.15–1.40)
HCT: 43 % (ref 39.0–52.0)
HCT: 47 % (ref 39.0–52.0)
HCT: 44 % (ref 39.0–52.0)
Hemoglobin: 14.6 g/dL (ref 13.0–17.0)
Hemoglobin: 16 g/dL (ref 13.0–17.0)
Hemoglobin: 15 g/dL (ref 13.0–17.0)
O2 Saturation: 100 %
O2 Saturation: 96 %
O2 Saturation: 97 %
Patient temperature: 98.6
Patient temperature: 103.1
Patient temperature: 100.4
Potassium: 3.8 mmol/L (ref 3.5–5.1)
Potassium: 3.8 mmol/L (ref 3.5–5.1)
Potassium: 3.7 mmol/L (ref 3.5–5.1)
Sodium: 139 mmol/L (ref 135–145)
Sodium: 138 mmol/L (ref 135–145)
Sodium: 135 mmol/L (ref 135–145)
TCO2: 28 mmol/L (ref 22–32)
TCO2: 27 mmol/L (ref 22–32)
TCO2: 26 mmol/L (ref 22–32)
pCO2 arterial: 39.9 mmHg (ref 32–48)
pCO2 arterial: 48.2 mmHg — ABNORMAL HIGH (ref 32–48)
pCO2 arterial: 41.1 mmHg (ref 32–48)
pH, Arterial: 7.434 (ref 7.35–7.45)
pH, Arterial: 7.348 — ABNORMAL LOW (ref 7.35–7.45)
pH, Arterial: 7.399 (ref 7.35–7.45)
pO2, Arterial: 211 mmHg — ABNORMAL HIGH (ref 83–108)
pO2, Arterial: 100 mmHg (ref 83–108)
pO2, Arterial: 98 mmHg (ref 83–108)

## 2023-02-05 LAB — URINALYSIS, W/ REFLEX TO CULTURE (INFECTION SUSPECTED)
Glucose, UA: NEGATIVE mg/dL
Ketones, ur: 5 mg/dL — AB
Nitrite: NEGATIVE
Protein, ur: 100 mg/dL — AB
RBC / HPF: >50 RBC/hpf (ref 0–5)
Specific Gravity, Urine: 1.034 — ABNORMAL HIGH (ref 1.005–1.030)
pH: 5 (ref 5.0–8.0)

## 2023-02-05 LAB — PROTIME-INR
INR: 1.2 (ref 0.8–1.2)
INR: 1.2 (ref 0.8–1.2)
INR: 1.2 (ref 0.8–1.2)
Prothrombin Time: 15 s (ref 11.4–15.2)
Prothrombin Time: 15 s (ref 11.4–15.2)
Prothrombin Time: 15.1 s (ref 11.4–15.2)

## 2023-02-05 LAB — CBC
HCT: 42.5 % (ref 39.0–52.0)
HCT: 44.5 % (ref 39.0–52.0)
HCT: 46.2 % (ref 39.0–52.0)
Hemoglobin: 14.7 g/dL (ref 13.0–17.0)
Hemoglobin: 15.3 g/dL (ref 13.0–17.0)
Hemoglobin: 15.9 g/dL (ref 13.0–17.0)
MCH: 33.2 pg (ref 26.0–34.0)
MCH: 33.2 pg (ref 26.0–34.0)
MCH: 33.6 pg (ref 26.0–34.0)
MCHC: 34.4 g/dL (ref 30.0–36.0)
MCHC: 34.4 g/dL (ref 30.0–36.0)
MCHC: 34.6 g/dL (ref 30.0–36.0)
MCV: 96.5 fL (ref 80.0–100.0)
MCV: 96.5 fL (ref 80.0–100.0)
MCV: 97.3 fL (ref 80.0–100.0)
Platelets: 180 10*3/uL (ref 150–400)
Platelets: 221 10*3/uL (ref 150–400)
Platelets: 223 10*3/uL (ref 150–400)
RBC: 4.37 MIL/uL (ref 4.22–5.81)
RBC: 4.61 MIL/uL (ref 4.22–5.81)
RBC: 4.79 MIL/uL (ref 4.22–5.81)
RDW: 13.5 % (ref 11.5–15.5)
RDW: 13.5 % (ref 11.5–15.5)
RDW: 13.6 % (ref 11.5–15.5)
WBC: 7.4 10*3/uL (ref 4.0–10.5)
WBC: 8.3 10*3/uL (ref 4.0–10.5)
WBC: 9.9 10*3/uL (ref 4.0–10.5)
nRBC: 0 % (ref 0.0–0.2)
nRBC: 0 % (ref 0.0–0.2)
nRBC: 0 % (ref 0.0–0.2)

## 2023-02-05 LAB — COMPREHENSIVE METABOLIC PANEL
ALT: 82 U/L — ABNORMAL HIGH (ref 0–44)
ALT: 85 U/L — ABNORMAL HIGH (ref 0–44)
ALT: 80 U/L — ABNORMAL HIGH (ref 0–44)
AST: 135 U/L — ABNORMAL HIGH (ref 15–41)
AST: 135 U/L — ABNORMAL HIGH (ref 15–41)
AST: 139 U/L — ABNORMAL HIGH (ref 15–41)
Albumin: 3.6 g/dL (ref 3.5–5.0)
Albumin: 4 g/dL (ref 3.5–5.0)
Albumin: 3.7 g/dL (ref 3.5–5.0)
Alkaline Phosphatase: 99 U/L (ref 38–126)
Alkaline Phosphatase: 106 U/L (ref 38–126)
Alkaline Phosphatase: 102 U/L (ref 38–126)
Anion gap: 8 (ref 5–15)
Anion gap: 11 (ref 5–15)
Anion gap: 12 (ref 5–15)
BUN: 16 mg/dL (ref 6–20)
BUN: 18 mg/dL (ref 6–20)
BUN: 18 mg/dL (ref 6–20)
CO2: 25 mmol/L (ref 22–32)
CO2: 24 mmol/L (ref 22–32)
CO2: 24 mmol/L (ref 22–32)
Calcium: 9.1 mg/dL (ref 8.9–10.3)
Calcium: 9 mg/dL (ref 8.9–10.3)
Calcium: 9.1 mg/dL (ref 8.9–10.3)
Chloride: 102 mmol/L (ref 98–111)
Chloride: 100 mmol/L (ref 98–111)
Chloride: 100 mmol/L (ref 98–111)
Creatinine, Ser: 1.58 mg/dL — ABNORMAL HIGH (ref 0.61–1.24)
Creatinine, Ser: 1.61 mg/dL — ABNORMAL HIGH (ref 0.61–1.24)
Creatinine, Ser: 1.31 mg/dL — ABNORMAL HIGH (ref 0.61–1.24)
GFR, Estimated: 50 mL/min — ABNORMAL LOW (ref >60)
GFR, Estimated: 49 mL/min — ABNORMAL LOW (ref >60)
GFR, Estimated: >60 mL/min (ref >60)
Glucose, Bld: 105 mg/dL — ABNORMAL HIGH (ref 70–99)
Glucose, Bld: 118 mg/dL — ABNORMAL HIGH (ref 70–99)
Glucose, Bld: 122 mg/dL — ABNORMAL HIGH (ref 70–99)
Potassium: 3.6 mmol/L (ref 3.5–5.1)
Potassium: 3.6 mmol/L (ref 3.5–5.1)
Potassium: 3.6 mmol/L (ref 3.5–5.1)
Sodium: 135 mmol/L (ref 135–145)
Sodium: 135 mmol/L (ref 135–145)
Sodium: 136 mmol/L (ref 135–145)
Total Bilirubin: 1.1 mg/dL (ref 0.3–1.2)
Total Bilirubin: 1.5 mg/dL — ABNORMAL HIGH (ref 0.3–1.2)
Total Bilirubin: 1.1 mg/dL (ref 0.3–1.2)
Total Protein: 6.8 g/dL (ref 6.5–8.1)
Total Protein: 7.7 g/dL (ref 6.5–8.1)
Total Protein: 7.4 g/dL (ref 6.5–8.1)

## 2023-02-05 LAB — MAGNESIUM
Magnesium: 1.8 mg/dL (ref 1.7–2.4)
Magnesium: 2.3 mg/dL (ref 1.7–2.4)

## 2023-02-05 LAB — URINALYSIS, ROUTINE W REFLEX MICROSCOPIC
Bacteria, UA: NONE SEEN
Bilirubin Urine: NEGATIVE
Glucose, UA: NEGATIVE mg/dL
Ketones, ur: 5 mg/dL — AB
Leukocytes,Ua: NEGATIVE
Nitrite: NEGATIVE
Protein, ur: NEGATIVE mg/dL
Specific Gravity, Urine: 1.016 (ref 1.005–1.030)
pH: 5 (ref 5.0–8.0)

## 2023-02-05 LAB — FIBRINOGEN
Fibrinogen: 473 mg/dL (ref 210–475)
Fibrinogen: 548 mg/dL — ABNORMAL HIGH (ref 210–475)
Fibrinogen: 615 mg/dL — ABNORMAL HIGH (ref 210–475)
Fibrinogen: 653 mg/dL — ABNORMAL HIGH (ref 210–475)

## 2023-02-05 LAB — GLUCOSE, CAPILLARY
Glucose-Capillary: 102 mg/dL — ABNORMAL HIGH (ref 70–99)
Glucose-Capillary: 116 mg/dL — ABNORMAL HIGH (ref 70–99)
Glucose-Capillary: 121 mg/dL — ABNORMAL HIGH (ref 70–99)
Glucose-Capillary: 121 mg/dL — ABNORMAL HIGH (ref 70–99)
Glucose-Capillary: 119 mg/dL — ABNORMAL HIGH (ref 70–99)
Glucose-Capillary: 130 mg/dL — ABNORMAL HIGH (ref 70–99)

## 2023-02-05 LAB — APTT
aPTT: 29 s (ref 24–36)
aPTT: 30 s (ref 24–36)
aPTT: 31 s (ref 24–36)
aPTT: 34 s (ref 24–36)

## 2023-02-05 LAB — CK
Total CK: 1747 U/L — ABNORMAL HIGH (ref 49–397)
Total CK: 2737 U/L — ABNORMAL HIGH (ref 49–397)

## 2023-02-05 LAB — LIPASE, BLOOD
Lipase: 34 U/L (ref 11–51)
Lipase: 32 U/L (ref 11–51)

## 2023-02-05 LAB — BILIRUBIN, DIRECT
Bilirubin, Direct: 0.3 mg/dL — ABNORMAL HIGH (ref 0.0–0.2)
Bilirubin, Direct: 0.3 mg/dL — ABNORMAL HIGH (ref 0.0–0.2)

## 2023-02-05 LAB — PHOSPHORUS
Phosphorus: 3.3 mg/dL (ref 2.5–4.6)
Phosphorus: 5.3 mg/dL — ABNORMAL HIGH (ref 2.5–4.6)

## 2023-02-05 LAB — AMYLASE
Amylase: 38 U/L (ref 28–100)
Amylase: 36 U/L (ref 28–100)

## 2023-02-05 LAB — SARS CORONAVIRUS 2 BY RT PCR: SARS Coronavirus 2 by RT PCR: NEGATIVE

## 2023-02-05 MED ORDER — POTASSIUM CHLORIDE 10 MEQ/100ML IV SOLN
10.0000 meq | INTRAVENOUS | Status: AC
  Administered 2023-02-05 (×4): 10 meq via INTRAVENOUS
  Filled 2023-02-05 (×4): qty 100

## 2023-02-05 MED ORDER — FUROSEMIDE 10 MG/ML IJ SOLN
40.0000 mg | Freq: Four times a day (QID) | INTRAMUSCULAR | Status: DC
  Administered 2023-02-05 – 2023-02-06 (×4): 40 mg via INTRAVENOUS
  Filled 2023-02-05 (×4): qty 4

## 2023-02-05 MED ORDER — SODIUM PHOSPHATES 45 MMOLE/15ML IV SOLN
15.0000 mmol | Freq: Once | INTRAVENOUS | Status: AC
  Administered 2023-02-05: 15 mmol via INTRAVENOUS
  Filled 2023-02-05: qty 5

## 2023-02-05 MED ORDER — CHLORHEXIDINE GLUCONATE CLOTH 2 % EX PADS
6.0000 | MEDICATED_PAD | Freq: Every day | CUTANEOUS | Status: DC
  Administered 2023-02-05 – 2023-02-06 (×2): 6 via TOPICAL

## 2023-02-05 MED ORDER — MAGNESIUM SULFATE 2 GM/50ML IV SOLN
2.0000 g | Freq: Once | INTRAVENOUS | Status: AC
  Administered 2023-02-05: 2 g via INTRAVENOUS
  Filled 2023-02-05: qty 50

## 2023-02-05 MED ORDER — FUROSEMIDE 10 MG/ML IJ SOLN
40.0000 mg | Freq: Once | INTRAMUSCULAR | Status: AC
  Administered 2023-02-05: 40 mg via INTRAVENOUS
  Filled 2023-02-05: qty 4

## 2023-02-05 MED ORDER — POTASSIUM CHLORIDE 20 MEQ PO PACK
40.0000 meq | PACK | Freq: Once | ORAL | Status: AC
  Administered 2023-02-05: 40 meq
  Filled 2023-02-05: qty 2

## 2023-02-05 NOTE — Plan of Care (Signed)
Problem: Health Behavior/Discharge Planning: Goal: Goals will be collaboratively established with patient/family Outcome: Progressing

## 2023-02-05 NOTE — Plan of Care (Signed)
Noted that family has decided for organ donation. CCM is on board for preparation for donation. Appreciate assistance from Dr. Francine Graven. Neuro will sign off for now, please feel free to call us with any questions.   Marvel Plan, MD PhD Stroke Neurology 02/05/2023 9:43 AM

## 2023-02-05 NOTE — Progress Notes (Signed)
eLink Physician-Brief Progress Note Patient Name: Eugene Hardin DOB: 10-11-1960 MRN: [id number]   Date of Service  02/05/2023  HPI/Events of Note  K 3.6, Cr 1.31 @ 1721...has OGT  eICU Interventions  Potassium packet 40 meqs per OGT ordered     Intervention Category Intermediate Interventions: Electrolyte abnormality - evaluation and management  Darl Pikes 02/05/2023, 9:52 PM

## 2023-02-05 NOTE — Consult Note (Signed)
NAME:  Eugene Hardin, MRN:  [id number], DOB:  02/15/1961, LOS: 1 ADMISSION DATE:  02/09/2023, CONSULTATION DATE:  02/11/2023 REFERRING MD:  Amada Jupiter, CHIEF COMPLAINT:  AMS // endotracheally intubated    History of Present Illness:  62 yo M pmh HTN HLD who presented to ED 9/16 after being found down by his wife. Pt seemed to have rolled out of bed, hitting the floor. His speech was reportedly garbled, EMS was dispatched. En route his mental status declined and he required BVM. He was intubated in ED, taken for CT H which unfortunately revealed a devastating brainstem hemorrhage.   Admitted to neuro, who has spoken to family about this non-survivable bleed. His code status is subsequently updated to DNR, with expected transition to comfort care when resp of family has gathered    PCCM is consulted in this setting   Pertinent  Medical History  HTN HLD GERD Chronic pain Fibromyalgia Depression  Arthritis   Significant Hospital Events: Including procedures, antibiotic start and stop dates in addition to other pertinent events   02/18/2023 admitted for supportive care off non-survivable brainstem hemorrhage, made DNR and plan for organ donation  Interim History / Subjective:   Awaiting organ donation tomorrow Organ donor services involved in case  Objective   Blood pressure (!) 144/73, pulse 91, temperature (!) 103.5 F (39.7 C), resp. rate (!) 26, height 5\' 2"  (1.575 m), weight 99.3 kg, SpO2 99%.    Vent Mode: PRVC FiO2 (%):  [70 %-100 %] 70 % Set Rate:  [26 bmp] 26 bmp Vt Set:  [490 mL] 490 mL PEEP:  [8 cmH20-12 cmH20] 12 cmH20 Plateau Pressure:  [22 cmH20-26 cmH20] 25 cmH20   Intake/Output Summary (Last 24 hours) at 02/05/2023 1000 Last data filed at 02/05/2023 0600 Gross per 24 hour  Intake 1475.29 ml  Output 725 ml  Net 750.29 ml   Filed Weights   01/21/2023 0829 01/29/2023 1600  Weight: 120 kg 99.3 kg    Examination: General: critically ill obese adult M intubated  NAD HENT: NCAT pink mm anicteric sclera ETT secure  Lungs: mechanically ventilated  Cardiovascular: rrr  Abdomen: obese soft  Extremities: no acute joint deformity.  Neuro: sedated GU: foley   Resolved Hospital Problem list     Assessment & Plan:   DNR status  Awaiting organ donation Large Brainstem hemorrhage  Acute respiratory failure with hypercarbia HTN  Hx HLD Hx GERD Hx chronic pain  P -Supportive measures until organ donation -continue mechanical ventilatory support - continue maintenance fluids - continue sedation -VAP, pulm hygiene  -no chemical dvt ppx or antiplt    Labs   CBC: Recent Labs  Lab 01/28/2023 0821 01/31/2023 0829 01/31/2023 1753 01/31/2023 2201 02/09/2023 2343 02/05/23 0128 02/05/23 0508  WBC 10.1  --   --  8.1 8.3  --  7.4  NEUTROABS 3.1  --   --   --   --   --   --   HGB 16.5   < > 15.6 15.3 15.3 14.6 14.7  HCT 50.4   < > 46.0 44.5 44.5 43.0 42.5  MCV 100.8*  --   --  96.1 96.5  --  97.3  PLT 215  --   --  227 223  --  180   < > = values in this interval not displayed.    Basic Metabolic Panel: Recent Labs  Lab 02/18/2023 0821 01/23/2023 0829 01/24/2023 0830 01/27/2023 1051 02/11/2023 1731 02/15/2023 1753 01/28/2023 2201 02/05/23 0128 02/05/23  0508  NA 139 140   < > 141 138 141  --  139 135  K 4.0 4.1   < > 3.7 3.4* 3.6  --  3.8 3.6  CL 101 102  --   --  103  --   --   --  102  CO2 30  --   --   --  26  --   --   --  25  GLUCOSE 161* 162*  --   --  102*  --   --   --  105*  BUN 9 11  --   --  8  --   --   --  16  CREATININE 0.76 0.70  --   --  0.99  --   --   --  1.58*  CALCIUM 9.0  --   --   --  9.3  --   --   --  9.1  MG  --   --   --   --   --   --  1.7  --   --   PHOS  --   --   --   --   --   --  2.4*  --   --    < > = values in this interval not displayed.   GFR: Estimated Creatinine Clearance: 51 mL/min (A) (by C-G formula based on SCr of 1.58 mg/dL (H)). Recent Labs  Lab 02/10/2023 0821 02/09/2023 1846 01/24/2023 2201 01/29/2023 2343  02/05/23 0508  WBC 10.1  --  8.1 8.3 7.4  LATICACIDVEN  --  1.4  --   --   --     Liver Function Tests: Recent Labs  Lab 01/27/2023 0821 01/27/2023 1731 02/05/23 0508  AST 123* 147* 135*  ALT 94* 93* 82*  ALKPHOS 137* 112 99  BILITOT 0.7 0.8 1.1  PROT 8.4* 7.4 6.8  ALBUMIN 4.3 4.0 3.6   No results for input(s): "LIPASE", "AMYLASE" in the last 168 hours. No results for input(s): "AMMONIA" in the last 168 hours.  ABG    Component Value Date/Time   PHART 7.434 02/05/2023 0128   PCO2ART 39.9 02/05/2023 0128   PO2ART 211 (H) 02/05/2023 0128   HCO3 26.7 02/05/2023 0128   TCO2 28 02/05/2023 0128   ACIDBASEDEF 2.0 01/21/2023 0830   O2SAT 100 02/05/2023 0128     Coagulation Profile: Recent Labs  Lab 02/09/2023 0821 02/14/2023 1846 02/07/2023 2343  INR 1.1 1.1 1.2    Cardiac Enzymes: No results for input(s): "CKTOTAL", "CKMB", "CKMBINDEX", "TROPONINI" in the last 168 hours.  HbA1C: Hgb A1c MFr Bld  Date/Time Value Ref Range Status  01/29/2023 06:00 PM 5.0 4.8 - 5.6 % Final    Comment:    (NOTE) Pre diabetes:          5.7%-6.4%  Diabetes:              >6.4%  Glycemic control for   <7.0% adults with diabetes     CBG: Recent Labs  Lab 01/31/2023 1700 01/20/2023 1917 02/10/2023 2328 02/05/23 0311 02/05/23 0729  GLUCAP 108* 108* 105* 102* 116*       Critical care time: 33 min     CRITICAL CARE Performed by: Martina Sinner  Total critical care time: 33 minutes  Critical care time was exclusive of separately billable procedures and treating other patients. Critical care was necessary to treat or prevent imminent or life-threatening deterioration.  Critical care was time spent  personally by me on the following activities: development of treatment plan with patient and/or surrogate as well as nursing, discussions with consultants, evaluation of patient's response to treatment, examination of patient, obtaining history from patient or surrogate, ordering and  performing treatments and interventions, ordering and review of laboratory studies, ordering and review of radiographic studies, pulse oximetry and re-evaluation of patient's condition.  Melody Comas, MD [facility] Office: [phone]   See Amion for personal pager PCCM on call pager [phone] until 7pm. Please call Elink 7p-7a. [phone]

## 2023-02-05 NOTE — Plan of Care (Signed)
  Problem: Education: Goal: Knowledge of disease or condition will improve Outcome: Not Progressing Goal: Knowledge of secondary prevention will improve (MUST DOCUMENT ALL) Outcome: Not Progressing Goal: Knowledge of patient specific risk factors will improve Loraine Leriche N/A or DELETE if not current risk factor) Outcome: Not Progressing   Problem: Intracerebral Hemorrhage Tissue Perfusion: Goal: Complications of Intracerebral Hemorrhage will be minimized Outcome: Not Progressing   Problem: Coping: Goal: Will verbalize positive feelings about self Outcome: Not Progressing Goal: Will identify appropriate support needs Outcome: Not Progressing   Problem: Health Behavior/Discharge Planning: Goal: Ability to manage health-related needs will improve Outcome: Not Progressing Goal: Goals will be collaboratively established with patient/family Outcome: Not Progressing   Problem: Self-Care: Goal: Ability to participate in self-care as condition permits will improve Outcome: Not Progressing Goal: Verbalization of feelings and concerns over difficulty with self-care will improve Outcome: Not Progressing Goal: Ability to communicate needs accurately will improve Outcome: Not Progressing   Problem: Nutrition: Goal: Risk of aspiration will decrease Outcome: Not Progressing Goal: Dietary intake will improve Outcome: Not Progressing   Problem: Activity: Goal: Ability to tolerate increased activity will improve Outcome: Not Progressing   Problem: Respiratory: Goal: Ability to maintain a clear airway and adequate ventilation will improve Outcome: Not Progressing   Problem: Role Relationship: Goal: Method of communication will improve Outcome: Not Progressing   Problem: Education: Goal: Knowledge of General Education information will improve Description: Including pain rating scale, medication(s)/side effects and non-pharmacologic comfort measures Outcome: Not Progressing   Problem:  Health Behavior/Discharge Planning: Goal: Ability to manage health-related needs will improve Outcome: Not Progressing   Problem: Clinical Measurements: Goal: Ability to maintain clinical measurements within normal limits will improve Outcome: Not Progressing Goal: Will remain free from infection Outcome: Not Progressing Goal: Diagnostic test results will improve Outcome: Not Progressing Goal: Respiratory complications will improve Outcome: Not Progressing Goal: Cardiovascular complication will be avoided Outcome: Not Progressing   Problem: Activity: Goal: Risk for activity intolerance will decrease Outcome: Not Progressing   Problem: Nutrition: Goal: Adequate nutrition will be maintained Outcome: Not Progressing   Problem: Coping: Goal: Level of anxiety will decrease Outcome: Not Progressing   Problem: Elimination: Goal: Will not experience complications related to bowel motility Outcome: Not Progressing Goal: Will not experience complications related to urinary retention Outcome: Not Progressing   Problem: Pain Managment: Goal: General experience of comfort will improve Outcome: Not Progressing   Problem: Safety: Goal: Ability to remain free from injury will improve Outcome: Not Progressing   Problem: Skin Integrity: Goal: Risk for impaired skin integrity will decrease Outcome: Not Progressing

## 2023-02-05 NOTE — Progress Notes (Signed)
eLink Physician-Brief Progress Note Patient Name: BOLIVAR FIGURA DOB: Sep 19, 1960 MRN: [id number]   Date of Service  02/05/2023  HPI/Events of Note  Still oligoanuric after 500 cc fluid bolus  eICU Interventions  Continue maintenance fluids 0.45% at 50 cc/hr for now AM labs pending     Intervention Category Intermediate Interventions: Oliguria - evaluation and management  Darl Pikes 02/05/2023, 3:35 AM

## 2023-02-06 ENCOUNTER — Other Ambulatory Visit: Payer: Self-pay

## 2023-02-06 ENCOUNTER — Encounter (HOSPITAL_COMMUNITY): Payer: Self-pay | Admitting: Certified Registered Nurse Anesthetist

## 2023-02-06 ENCOUNTER — Encounter (HOSPITAL_COMMUNITY): Admission: EM | Disposition: E | Payer: Self-pay | Source: Home / Self Care | Attending: Pulmonary Disease

## 2023-02-06 DIAGNOSIS — J9602 Acute respiratory failure with hypercapnia: Secondary | ICD-10-CM | POA: Diagnosis not present

## 2023-02-06 HISTORY — PX: ORGAN PROCUREMENT: SHX5270

## 2023-02-06 LAB — CK
Total CK: 2495 U/L — ABNORMAL HIGH (ref 49–397)
Total CK: 2532 U/L — ABNORMAL HIGH (ref 49–397)

## 2023-02-06 LAB — URINALYSIS, W/ REFLEX TO CULTURE (INFECTION SUSPECTED)
Bilirubin Urine: NEGATIVE
Glucose, UA: NEGATIVE mg/dL
Ketones, ur: NEGATIVE mg/dL
Nitrite: NEGATIVE
Protein, ur: 30 mg/dL — AB
Specific Gravity, Urine: 1.021 (ref 1.005–1.030)
pH: 5 (ref 5.0–8.0)

## 2023-02-06 LAB — URINALYSIS, ROUTINE W REFLEX MICROSCOPIC
Bilirubin Urine: NEGATIVE
Glucose, UA: NEGATIVE mg/dL
Ketones, ur: NEGATIVE mg/dL
Nitrite: NEGATIVE
Protein, ur: 30 mg/dL — AB
Specific Gravity, Urine: 1.021 (ref 1.005–1.030)
pH: 5 (ref 5.0–8.0)

## 2023-02-06 LAB — POCT I-STAT 7, (LYTES, BLD GAS, ICA,H+H)
Acid-Base Excess: 0 mmol/L (ref 0.0–2.0)
Acid-Base Excess: 1 mmol/L (ref 0.0–2.0)
Bicarbonate: 26.2 mmol/L (ref 20.0–28.0)
Bicarbonate: 26.5 mmol/L (ref 20.0–28.0)
Calcium, Ion: 1.15 mmol/L (ref 1.15–1.40)
Calcium, Ion: 1.16 mmol/L (ref 1.15–1.40)
HCT: 43 % (ref 39.0–52.0)
HCT: 44 % (ref 39.0–52.0)
Hemoglobin: 14.6 g/dL (ref 13.0–17.0)
Hemoglobin: 15 g/dL (ref 13.0–17.0)
O2 Saturation: 98 %
O2 Saturation: 98 %
Patient temperature: 101.3
Patient temperature: 103
Potassium: 3.9 mmol/L (ref 3.5–5.1)
Potassium: 4.5 mmol/L (ref 3.5–5.1)
Sodium: 134 mmol/L — ABNORMAL LOW (ref 135–145)
Sodium: 135 mmol/L (ref 135–145)
TCO2: 27 mmol/L (ref 22–32)
TCO2: 28 mmol/L (ref 22–32)
pCO2 arterial: 47.2 mmHg (ref 32–48)
pCO2 arterial: 54.9 mmHg — ABNORMAL HIGH (ref 32–48)
pH, Arterial: 7.303 — ABNORMAL LOW (ref 7.35–7.45)
pH, Arterial: 7.358 (ref 7.35–7.45)
pO2, Arterial: 119 mmHg — ABNORMAL HIGH (ref 83–108)
pO2, Arterial: 135 mmHg — ABNORMAL HIGH (ref 83–108)

## 2023-02-06 LAB — PHOSPHORUS: Phosphorus: 5.7 mg/dL — ABNORMAL HIGH (ref 2.5–4.6)

## 2023-02-06 LAB — PROTIME-INR
INR: 1.2 (ref 0.8–1.2)
Prothrombin Time: 14.9 s (ref 11.4–15.2)

## 2023-02-06 LAB — APTT: aPTT: 33 s (ref 24–36)

## 2023-02-06 LAB — GLUCOSE, CAPILLARY
Glucose-Capillary: 134 mg/dL — ABNORMAL HIGH (ref 70–99)
Glucose-Capillary: 155 mg/dL — ABNORMAL HIGH (ref 70–99)
Glucose-Capillary: 139 mg/dL — ABNORMAL HIGH (ref 70–99)

## 2023-02-06 LAB — MAGNESIUM: Magnesium: 2.1 mg/dL (ref 1.7–2.4)

## 2023-02-06 LAB — FIBRINOGEN: Fibrinogen: 737 mg/dL — ABNORMAL HIGH (ref 210–475)

## 2023-02-06 LAB — LIPASE, BLOOD: Lipase: 30 U/L (ref 11–51)

## 2023-02-06 SURGERY — SURGICAL PROCUREMENT, ORGAN
Anesthesia: Choice | Site: Abdomen

## 2023-02-06 MED ORDER — POLYVINYL ALCOHOL 1.4 % OP SOLN: 1.0000 [drp] | Freq: Four times a day (QID) | OPHTHALMIC | Status: DC | PRN

## 2023-02-06 MED ORDER — GLYCOPYRROLATE 0.2 MG/ML IJ SOLN: 0.2000 mg | INTRAMUSCULAR | Status: DC | PRN

## 2023-02-06 MED ORDER — PIPERACILLIN-TAZOBACTAM 3.375 G IVPB 30 MIN
3.3750 g | Freq: Once | INTRAVENOUS | Status: AC
  Administered 2023-02-06: 3.375 g via INTRAVENOUS
  Filled 2023-02-06: qty 50

## 2023-02-06 MED ORDER — SODIUM CHLORIDE 0.9 % IV SOLN: INTRAVENOUS | Status: DC

## 2023-02-06 MED ORDER — GLYCOPYRROLATE 1 MG PO TABS: 1.0000 mg | ORAL_TABLET | ORAL | Status: DC | PRN

## 2023-02-06 MED ORDER — 0.9 % SODIUM CHLORIDE (POUR BTL) OPTIME
TOPICAL | Status: DC | PRN
Start: 2023-02-06 — End: 2023-02-06
  Administered 2023-02-06: 8000 mL

## 2023-02-06 MED ORDER — ACETAMINOPHEN 160 MG/5ML PO SOLN: 650.0000 mg | Freq: Four times a day (QID) | ORAL | Status: DC | PRN

## 2023-02-06 MED ORDER — MORPHINE BOLUS VIA INFUSION: 5.0000 mg | INTRAVENOUS | Status: DC | PRN

## 2023-02-06 MED ORDER — ACETAMINOPHEN 10 MG/ML IV SOLN
1000.0000 mg | Freq: Four times a day (QID) | INTRAVENOUS | Status: DC | PRN
  Administered 2023-02-06: 1000 mg via INTRAVENOUS
  Filled 2023-02-06: qty 100

## 2023-02-06 MED ORDER — ACETAMINOPHEN 650 MG RE SUPP: 650.0000 mg | Freq: Four times a day (QID) | RECTAL | Status: DC | PRN

## 2023-02-06 MED ORDER — HEPARIN SODIUM 10000 UNIT/ML FOR ORGAN DONATION
100.0000 [IU]/kg | Freq: Once | INTRAMUSCULAR | Status: AC
  Administered 2023-02-06: 9900 [IU] via INTRAVENOUS
  Filled 2023-02-06: qty 0.99

## 2023-02-06 MED ORDER — MORPHINE 100MG IN NS 100ML (1MG/ML) PREMIX INFUSION
0.0000 mg/h | INTRAVENOUS | Status: DC
  Administered 2023-02-06: 5 mg/h via INTRAVENOUS
  Filled 2023-02-06: qty 100

## 2023-02-06 MED ORDER — HEPARIN SODIUM 10000 UNIT/ML FOR ORGAN DONATION
300.0000 [IU]/kg | INTRAMUSCULAR | Status: DC
  Filled 2023-02-06: qty 3

## 2023-02-06 SURGICAL SUPPLY — 90 items
APPLIER CLIP 11 MED OPEN (CLIP) ×2 IMPLANT
APR CLP MED 11 20 MLT OPN (CLIP) ×2
BAG COUNTER SPONGE SURGICOUNT (BAG) ×2 IMPLANT
BAG SPNG CNTER NS LX DISP (BAG) ×1
BLADE CLIPPER SURG (BLADE) IMPLANT
BLADE SAW STERNAL (BLADE) ×2 IMPLANT
BLADE SURG 10 STRL SS (BLADE) IMPLANT
CLIP APPLIE 11 MED OPEN (CLIP) ×2 IMPLANT
CLIP TI MEDIUM 24 (CLIP) IMPLANT
CLIP TI WIDE RED SMALL 24 (CLIP) IMPLANT
CNTNR URN SCR LID CUP LEK RST (MISCELLANEOUS) ×2 IMPLANT
CONT SPEC 4OZ STRL OR WHT (MISCELLANEOUS) ×1
COVER BACK TABLE 60X90IN (DRAPES) IMPLANT
COVER MAYO STAND STRL (DRAPES) IMPLANT
COVER SURGICAL LIGHT HANDLE (MISCELLANEOUS) ×2 IMPLANT
DRAPE HALF SHEET 40X57 (DRAPES) IMPLANT
DRAPE SLUSH MACHINE 52X66 (DRAPES) ×2 IMPLANT
DRSG COVADERM 4X10 (GAUZE/BANDAGES/DRESSINGS) IMPLANT
DRSG COVADERM 4X14 (GAUZE/BANDAGES/DRESSINGS) IMPLANT
DRSG TELFA 3X8 NADH STRL (GAUZE/BANDAGES/DRESSINGS) ×2 IMPLANT
DURAPREP 26ML APPLICATOR (WOUND CARE) IMPLANT
ELECT BLADE 6.5 EXT (BLADE) IMPLANT
ELECT REM PT RETURN 9FT ADLT (ELECTROSURGICAL) ×2 IMPLANT
ELECTRODE REM PT RTRN 9FT ADLT (ELECTROSURGICAL) ×4 IMPLANT
GAUZE 4X4 16PLY ~~LOC~~+RFID DBL (SPONGE) IMPLANT
GLOVE BIO SURGEON STRL SZ7 (GLOVE) IMPLANT
GLOVE BIO SURGEON STRL SZ7.5 (GLOVE) IMPLANT
GLOVE BIO SURGEON STRL SZ8 (GLOVE) IMPLANT
GLOVE BIO SURGEON STRL SZ8.5 (GLOVE) IMPLANT
GLOVE BIOGEL PI IND STRL 7.0 (GLOVE) IMPLANT
GLOVE BIOGEL PI IND STRL 7.5 (GLOVE) IMPLANT
GLOVE BIOGEL PI IND STRL 8 (GLOVE) IMPLANT
GLOVE BIOGEL PI IND STRL 8.5 (GLOVE) IMPLANT
GLOVE SURG SS PI 7.0 STRL IVOR (GLOVE) IMPLANT
GLOVE SURG SS PI 7.5 STRL IVOR (GLOVE) IMPLANT
GLOVE SURG SS PI 8.0 STRL IVOR (GLOVE) IMPLANT
GOWN STRL REUS W/ TWL LRG LVL3 (GOWN DISPOSABLE) ×8 IMPLANT
GOWN STRL REUS W/ TWL XL LVL3 (GOWN DISPOSABLE) ×4 IMPLANT
GOWN STRL REUS W/TWL LRG LVL3 (GOWN DISPOSABLE) ×4
GOWN STRL REUS W/TWL XL LVL3 (GOWN DISPOSABLE) ×2
HANDLE SUCTION POOLE (INSTRUMENTS) IMPLANT
KIT POST MORTEM ADULT 36X90 (BAG) ×2 IMPLANT
KIT TURNOVER KIT B (KITS) ×2 IMPLANT
LOOP VASCLR MAXI BLUE 18IN ST (MISCELLANEOUS) IMPLANT
LOOP VASCULAR MAXI 18 BLUE (MISCELLANEOUS)
LOOP VASCULAR MINI 18 RED (MISCELLANEOUS)
LOOPS VASCLR MAXI BLUE 18IN ST (MISCELLANEOUS) IMPLANT
MANIFOLD NEPTUNE II (INSTRUMENTS) ×2 IMPLANT
NDL BIOPSY 14X6 SOFT TISS (NEEDLE) IMPLANT
NEEDLE BIOPSY 14X6 SOFT TISS (NEEDLE) ×2 IMPLANT
NS IRRIG 1000ML POUR BTL (IV SOLUTION) IMPLANT
PACK AORTA (CUSTOM PROCEDURE TRAY) ×2 IMPLANT
PAD ARMBOARD 7.5X6 YLW CONV (MISCELLANEOUS) ×4 IMPLANT
PENCIL BUTTON HOLSTER BLD 10FT (ELECTRODE) ×2 IMPLANT
SOL PREP POV-IOD 4OZ 10% (MISCELLANEOUS) ×4 IMPLANT
SPONGE INTESTINAL PEANUT (DISPOSABLE) IMPLANT
SPONGE T-LAP 18X18 ~~LOC~~+RFID (SPONGE) IMPLANT
STAPLER VISISTAT 35W (STAPLE) ×2 IMPLANT
SUCTION POOLE HANDLE (INSTRUMENTS) ×2 IMPLANT
SUT BONE WAX W31G (SUTURE) IMPLANT
SUT ETHIBOND 5 LR DA (SUTURE) IMPLANT
SUT ETHILON 1 LR 30 (SUTURE) ×4 IMPLANT
SUT ETHILON 2 LR (SUTURE) IMPLANT
SUT PROLENE 3 0 RB 1 (SUTURE) IMPLANT
SUT PROLENE 3 0 SH 1 (SUTURE) IMPLANT
SUT PROLENE 4 0 RB 1 (SUTURE)
SUT PROLENE 4-0 RB1 .5 CRCL 36 (SUTURE) IMPLANT
SUT PROLENE 5 0 C 1 24 (SUTURE) IMPLANT
SUT PROLENE 6 0 BV (SUTURE) IMPLANT
SUT SILK 0 TIES 10X30 (SUTURE) IMPLANT
SUT SILK 1 SH (SUTURE) IMPLANT
SUT SILK 1 TIES 10X30 (SUTURE) IMPLANT
SUT SILK 2 0 (SUTURE)
SUT SILK 2 0 SH (SUTURE) IMPLANT
SUT SILK 2 0 SH CR/8 (SUTURE) IMPLANT
SUT SILK 2 0 TIES 10X30 (SUTURE) IMPLANT
SUT SILK 2-0 18XBRD TIE 12 (SUTURE) IMPLANT
SUT SILK 3 0 SH CR/8 (SUTURE) IMPLANT
SUT SILK 3 0 TIES 10X30 (SUTURE) IMPLANT
SWAB COLLECTION DEVICE MRSA (MISCELLANEOUS) IMPLANT
SWAB CULTURE ESWAB REG 1ML (MISCELLANEOUS) IMPLANT
SYR 50ML LL SCALE MARK (SYRINGE) IMPLANT
SYRINGE TOOMEY DISP (SYRINGE) IMPLANT
TAPE UMBILICAL 1/8 X36 TWILL (MISCELLANEOUS) IMPLANT
TIE VASCULAR MAXI BLUE 18IN ST (MISCELLANEOUS) IMPLANT
TUBE CONNECTING 12X1/4 (SUCTIONS) ×2 IMPLANT
VASCULAR TIE MAXI BLUE 18IN ST (MISCELLANEOUS)
VASCULAR TIE MINI RED 18IN STL (MISCELLANEOUS) IMPLANT
WATER STERILE IRR 1000ML POUR (IV SOLUTION) IMPLANT
YANKAUER SUCT BULB TIP NO VENT (SUCTIONS) ×2 IMPLANT

## 2023-02-07 ENCOUNTER — Encounter (HOSPITAL_COMMUNITY): Payer: Self-pay

## 2023-02-07 LAB — CULTURE, RESPIRATORY W GRAM STAIN: Culture: NORMAL

## 2023-02-07 LAB — CALCIUM, IONIZED
Calcium, Ionized, Serum: 4.6 mg/dL (ref 4.5–5.6)
Calcium, Ionized, Serum: 4.7 mg/dL (ref 4.5–5.6)
Calcium, Ionized, Serum: 4.4 mg/dL — ABNORMAL LOW (ref 4.5–5.6)

## 2023-02-07 LAB — GLUCOSE, CAPILLARY: Glucose-Capillary: 153 mg/dL — ABNORMAL HIGH (ref 70–99)

## 2023-02-08 ENCOUNTER — Encounter (HOSPITAL_COMMUNITY): Payer: Self-pay

## 2023-02-08 LAB — URINE CULTURE
Culture: 30000 — AB
Special Requests: NORMAL

## 2023-02-08 LAB — SURGICAL PATHOLOGY

## 2023-02-09 LAB — CULTURE, BLOOD (ROUTINE X 2)
Culture: NO GROWTH
Culture: NO GROWTH
Special Requests: ADEQUATE
Special Requests: ADEQUATE

## 2023-02-19 NOTE — Progress Notes (Signed)
Pt transported on Vent to OR with out any compilations.

## 2023-02-19 NOTE — Death Summary Note (Signed)
DEATH SUMMARY   Patient Details  Name: Eugene Hardin MRN: [id number] DOB: 1960/09/10  Admission/Discharge Information   Admit Date:  February 05, 2023  Date of Death: Date of Death: 2023/02/07  Time of Death: Time of Death: 04/10/21  Length of Stay: 2  Referring Physician: Blair Heys, MD   Reason(s) for Hospitalization  Intracerebral Hemorrhage  Diagnoses  Preliminary cause of death:  Intracerebral Hemorrhage  Secondary Diagnoses (including complications and co-morbidities):  Principal Problem:   ICH (intracerebral hemorrhage) (HCC) Active Problems:   Acute respiratory failure with hypercapnia (HCC)   DNR (do not resuscitate)   Goals of care, counseling/discussion Acute respiratory failure with hypercarbia HTN  Hx HLD Hx GERD Hx chronic pain  AKI Obesity, BMI 40  Brief Hospital Course (including significant findings, care, treatment, and services provided and events leading to death)  Eugene Hardin is a 62 y.o. year old male with hypertension and hyperlipidemia who was admitted Feb 05, 2023 for altered mental status found to have large (3.6 x 2.6 x 2.5 cm) acute intraparenchymal hemorrhage in the pons with intraventricular extension into the fourth ventricle with an estimated volume of 12 mL.  Intubated for airway protection, started on cleviprex. He was admitted to the ICU. Neurology had spoken with the family that this significant brain bleed is un-survivable. [facility] spoke with family and they decided to move forward with organ donation. Cardiac death at 1822 on 07-Feb-2023.   Pertinent Labs and Studies  Significant Diagnostic Studies DG Abd Portable 1V  Result Date: 02-05-2023 CLINICAL DATA:  OG tube placement. EXAM: PORTABLE ABDOMEN - 1 VIEW COMPARISON:  Earlier today FINDINGS: Tip and side port of the enteric tube below the diaphragm in the stomach. Overall paucity of upper abdominal bowel gas. IMPRESSION: Tip and side port of the enteric tube below the diaphragm in the  stomach. Electronically Signed   By: Narda Rutherford M.D.   On: 02/05/23 23:54   CT CHEST ABDOMEN PELVIS WO CONTRAST  Result Date: 02/05/23 CLINICAL DATA:  Organ donation, intracranial hemorrhage EXAM: CT CHEST, ABDOMEN AND PELVIS WITHOUT CONTRAST TECHNIQUE: Multidetector CT imaging of the chest, abdomen and pelvis was performed following the standard protocol without IV contrast. RADIATION DOSE REDUCTION: This exam was performed according to the departmental dose-optimization program which includes automated exposure control, adjustment of the mA and/or kV according to patient size and/or use of iterative reconstruction technique. COMPARISON:  10/13/2020 FINDINGS: CT CHEST FINDINGS Cardiovascular: Unenhanced imaging of the heart is unremarkable without pericardial effusion. Normal caliber of the thoracic aorta. Atherosclerosis of the aortic arch and coronary vasculature. Evaluation of the vascular lumen limited without IV contrast. Mediastinum/Nodes: Endotracheal tube terminates at the level of the carina. Enteric catheter extends into the gastric lumen. Thyroid is unremarkable. No pathologic adenopathy. Lungs/Pleura: Dependent hypoventilatory changes are seen within the bilateral lower lobes. Areas of ground-glass attenuation within the bilateral upper lobes may be related to edema, inflammation, or developing hypoventilatory change. No effusion or pneumothorax. Musculoskeletal: No acute or destructive bony abnormalities. Bilateral shoulder osteoarthritis. Reconstructed images demonstrate no additional findings. CT ABDOMEN PELVIS FINDINGS Hepatobiliary: Unremarkable unenhanced appearance of the liver and gallbladder. Pancreas: Unremarkable unenhanced appearance. Spleen: Unremarkable unenhanced appearance. Adrenals/Urinary Tract: There are punctate bilateral nonobstructing renal calculi measuring less than 3 mm. No obstructive uropathy. The adrenals are unremarkable. Bladder is decompressed with a Foley  catheter. Stomach/Bowel: No bowel obstruction or ileus. Normal appendix right lower quadrant. Enteric catheter tip within the gastric body. No bowel wall thickening or inflammatory change. Vascular/Lymphatic: Aortic  atherosclerosis. No enlarged abdominal or pelvic lymph nodes. Reproductive: Prostate is unremarkable. Other: No free fluid or free intraperitoneal gas. No abdominal wall hernia. Musculoskeletal: No acute or destructive bony abnormalities. Reconstructed images demonstrate no additional findings. IMPRESSION: 1. Endotracheal tube at level of carina, recommend retracting 2 cm. 2. Dependent lower lobe atelectasis. Ground-glass attenuation within the upper lobes could be due to developing hypoventilatory changes versus infection or edema. 3. Punctate bilateral less than 3 mm nonobstructing renal calculi. 4.  Aortic Atherosclerosis (ICD10-I70.0). Electronically Signed   By: Sharlet Salina M.D.   On: Feb 26, 2023 22:30   DG Chest Portable 1 View  Result Date: February 26, 2023 CLINICAL DATA:  post intubation. EXAM: PORTABLE CHEST 1 VIEW COMPARISON:  11/08/2020. FINDINGS: Low lung volume. There are probable patchy atelectatic changes at the lung bases. No dense consolidation or major lung collapse. Bilateral lung fields are grossly clear. Bilateral lateral costophrenic angles are clear. Normal cardio-mediastinal silhouette. No acute osseous abnormalities. The soft tissues are within normal limits. Endotracheal tube tip is 3.3 cm from the carina. An enteric tube extends below diaphragm, tip out of the view of the radiograph. The side hole is just below the left hemidiaphragm. The tube could be advanced 7-10 cm to place the side-hole more confidently in the stomach. IMPRESSION: *No active disease. *Endotracheal tube tip is 3.3 cm from the carina. *An enteric tube extends below diaphragm, tip out of the view of the radiograph. The side hole is just below the left hemidiaphragm. The tube could be advanced 7-10 cm to place  the side-hole more confidently in the stomach. Electronically Signed   By: Jules Schick M.D.   On: 2023-02-26 09:39   DG Abdomen 1 View  Result Date: 02-26-23 CLINICAL DATA:  Altered mental status status post enteric tube placement EXAM: ABDOMEN - 1 VIEW COMPARISON:  None Available. FINDINGS: Gastric/enteric tube tip projects over the stomach. Side hole projects over the gastric cardia. Partially imaged nonobstructive bowel gas pattern. IMPRESSION: Gastric/enteric tube tip projects over the stomach with side hole projecting over the gastric cardia. Consider advancing by 5 cm for more optimal positioning. Electronically Signed   By: Agustin Cree M.D.   On: 02/26/23 09:31   CT HEAD CODE STROKE WO CONTRAST  Result Date: 2023/02/26 CLINICAL DATA:  Code stroke.  Neuro deficit, acute, stroke suspected EXAM: CT HEAD WITHOUT CONTRAST TECHNIQUE: Contiguous axial images were obtained from the base of the skull through the vertex without intravenous contrast. RADIATION DOSE REDUCTION: This exam was performed according to the departmental dose-optimization program which includes automated exposure control, adjustment of the mA and/or kV according to patient size and/or use of iterative reconstruction technique. COMPARISON:  None Available. FINDINGS: Brain: Large (3.6 x 2.6 x 2.5 cm) acute intraparenchymal hemorrhage in the pons with intraventricular extension into the fourth ventricle. Estimated volume of 12 mL. No hydrocephalus at this time. No evidence of acute large vascular territory infarct or midline shift or visible mass lesion. Vascular: No hyperdense vessel identified. Skull: No acute fracture. Sinuses/Orbits: Mostly clear sinuses.  No acute orbital findings. Other: No mastoid effusions. IMPRESSION: 1. Large 3.6 cm acute intraparenchymal hemorrhage in the pons with intraventricular extension into the fourth ventricle. 2. No hydrocephalus. Code stroke imaging results were communicated on 02/26/23 at 9:07 am  to provider Dr. Amada Jupiter via telephone, who verbally acknowledged these results. Electronically Signed   By: Feliberto Harts M.D.   On: 2023/02/26 09:07    Microbiology Recent Results (from the past 240 hour(s))  MRSA  Next Gen by PCR, Nasal     Status: Abnormal   Collection Time: 01/22/2023 11:07 AM   Specimen: Nasal Mucosa; Nasal Swab  Result Value Ref Range Status   MRSA by PCR Next Gen NEGATIVE (A) NOT DETECTED Final    Comment: Performed at [facility], [address], [place], [place] [address]  Culture, blood (Routine X 2) w Reflex to ID Panel     Status: None (Preliminary result)   Collection Time: 02/13/2023  6:46 PM   Specimen: BLOOD  Result Value Ref Range Status   Specimen Description BLOOD SITE NOT SPECIFIED  Final   Special Requests   Final    BOTTLES DRAWN AEROBIC AND ANAEROBIC Blood Culture adequate volume   Culture   Final    NO GROWTH 4 DAYS Performed at [facility], [address] [place], [place] [address]    Report Status PENDING  Incomplete  Culture, blood (Routine X 2) w Reflex to ID Panel     Status: None (Preliminary result)   Collection Time: 01/21/2023  6:49 PM   Specimen: BLOOD  Result Value Ref Range Status   Specimen Description BLOOD SITE NOT SPECIFIED  Final   Special Requests   Final    BOTTLES DRAWN AEROBIC ONLY Blood Culture adequate volume   Culture   Final    NO GROWTH 4 DAYS Performed at [facility], [address], [place], [place] [address]    Report Status PENDING  Incomplete  SARS Coronavirus 2 by RT PCR (hospital order, performed in [facility] hospital lab) *cepheid single result test* Anterior Nasal Swab     Status: None   Collection Time: 02/07/2023 11:43 PM   Specimen: Anterior Nasal Swab  Result Value Ref Range Status   SARS Coronavirus 2 by RT PCR NEGATIVE NEGATIVE Final    Comment: Performed at [facility], [address], [place], [place] [address]  Culture, Respiratory w Gram Stain     Status: None    Collection Time: 02/14/2023 11:43 PM   Specimen: Bronchoalveolar Lavage; Respiratory  Result Value Ref Range Status   Specimen Description BRONCHIAL ALVEOLAR LAVAGE  Final   Special Requests NONE  Final   Gram Stain   Final    FEW WBC PRESENT,BOTH PMN AND MONONUCLEAR FEW GRAM POSITIVE COCCI IN PAIRS    Culture   Final    Normal respiratory flora-no Staph aureus or Pseudomonas seen Performed at [facility], [address], [place], [place] [address]    Report Status 02/07/2023 FINAL  Final  Urine Culture (for pregnant, neutropenic or urologic patients or patients with an indwelling urinary catheter)     Status: Abnormal   Collection Time: Feb 25, 2023 11:24 PM   Specimen: Urine, Catheterized  Result Value Ref Range Status   Specimen Description URINE, CATHETERIZED  Final   Special Requests   Final    Normal Performed at [facility], [address] [place], [place] [address]    Culture 30,000 COLONIES/mL ENTEROCOCCUS FAECALIS (A)  Final   Report Status 02/08/2023 FINAL  Final   Organism ID, Bacteria ENTEROCOCCUS FAECALIS (A)  Final      Susceptibility   Enterococcus faecalis - MIC*    AMPICILLIN <=2 SENSITIVE Sensitive     NITROFURANTOIN <=16 SENSITIVE Sensitive     VANCOMYCIN 1 SENSITIVE Sensitive     * 30,000 COLONIES/mL ENTEROCOCCUS FAECALIS    Lab Basic Metabolic Panel: Recent Labs  Lab  02/05/23 1054 02/05/23 1222 02/05/23 1721 02/05/23 1745 02/05/23 1803 02/05/23 2342 01/30/2023 0053 01/29/2023 0459 02/02/2023 1129 02/08/2023 1134 01/27/2023 1145  NA 135  --  136  --    < > 135 135 134* 134* 132*  --   K 3.6  --  3.6  --    < > 3.5 3.9 4.2 4.5 4.3  --   CL 100  --  100  --   --  98  --  98  --  96*  --   CO2 24  --  24  --   --  25  --  24  --  23  --   GLUCOSE 118*  --  122*  --   --  122*  --  143*  --  125*  --   BUN 18  --  18  --   --  17  --  18  --  21*  --   CREATININE 1.61*  --  1.31*  --   --  1.27*  --  1.37*  --  1.56*  --   CALCIUM 9.0  --  9.1   --   --  8.9  --  8.9  --  8.5*  --   MG  --  1.8  --  2.3  --  2.1  --  2.2  --   --  2.2  PHOS  --  3.3  --  5.3*  --  5.7*  --  5.8*  --   --  4.8*   < > = values in this interval not displayed.   Liver Function Tests: Recent Labs  Lab 02/05/23 1054 02/05/23 1721 02/05/23 2342 01/21/2023 0459 02/11/2023 1134  AST 135* 139* 135* 130* 126*  ALT 85* 80* 75* 73* 65*  ALKPHOS 106 102 98 102 89  BILITOT 1.5* 1.1 1.6* 1.5* 1.9*  PROT 7.7 7.4 7.5 7.7 7.7  ALBUMIN 4.0 3.7 3.6 3.6 3.4*   Recent Labs  Lab 02/05/23 1222 02/05/23 1745 02/05/23 2342  LIPASE 34 32 30  AMYLASE 38 36 34   No results for input(s): "AMMONIA" in the last 168 hours. CBC: Recent Labs  Lab 03/02/2023 0821 03-02-23 0829 02/05/23 0508 02/05/23 1032 02/05/23 1054 02/05/23 1803 02/05/23 2342 02/02/2023 0053 01/26/2023 0459 01/26/2023 1129 01/22/2023 1134  WBC 10.1   < > 7.4  --  9.9  --  8.1  --  8.8  --  8.4  NEUTROABS 3.1  --   --   --   --   --   --   --   --   --   --   HGB 16.5   < > 14.7   < > 15.9   < > 15.0 14.6 15.3 15.0 15.0  HCT 50.4   < > 42.5   < > 46.2   < > 43.3 43.0 43.9 44.0 42.3  MCV 100.8*   < > 97.3  --  96.5  --  95.6  --  95.0  --  97.0  PLT 215   < > 180  --  221  --  181  --  196  --  184   < > = values in this interval not displayed.   Cardiac Enzymes: Recent Labs  Lab 02/05/23 1222 02/05/23 1745 02/05/23 2342 01/27/2023 0459 02/15/2023 1145  CKTOTAL 1,747* 2,737* 2,495* 2,532* 2,036*   Sepsis Labs: Recent Labs  Lab 03/02/23 1846 2023/03/02 2201 02/05/23 1054 02/05/23  2342 02/12/2023 0459 01/29/2023 1134  WBC  --    < > 9.9 8.1 8.8 8.4  LATICACIDVEN 1.4  --   --   --   --   --    < > = values in this interval not displayed.    Procedures/Operations  Endotracheal Intubation Central Line Placement Arterial Line Placement   Martina Sinner 02/08/2023, 8:05 PM

## 2023-02-19 NOTE — Plan of Care (Signed)
  Problem: Education: Goal: Knowledge of disease or condition will improve Outcome: Not Progressing Goal: Knowledge of secondary prevention will improve (MUST DOCUMENT ALL) Outcome: Not Progressing Goal: Knowledge of patient specific risk factors will improve Loraine Leriche N/A or DELETE if not current risk factor) Outcome: Not Progressing   Problem: Intracerebral Hemorrhage Tissue Perfusion: Goal: Complications of Intracerebral Hemorrhage will be minimized Outcome: Not Progressing   Problem: Coping: Goal: Will verbalize positive feelings about self Outcome: Not Progressing Goal: Will identify appropriate support needs Outcome: Not Progressing   Problem: Health Behavior/Discharge Planning: Goal: Ability to manage health-related needs will improve Outcome: Not Progressing Goal: Goals will be collaboratively established with patient/family Outcome: Not Progressing   Problem: Self-Care: Goal: Ability to participate in self-care as condition permits will improve Outcome: Not Progressing Goal: Verbalization of feelings and concerns over difficulty with self-care will improve Outcome: Not Progressing Goal: Ability to communicate needs accurately will improve Outcome: Not Progressing   Problem: Nutrition: Goal: Risk of aspiration will decrease Outcome: Not Progressing Goal: Dietary intake will improve Outcome: Not Progressing   Problem: Activity: Goal: Ability to tolerate increased activity will improve Outcome: Not Progressing   Problem: Respiratory: Goal: Ability to maintain a clear airway and adequate ventilation will improve Outcome: Not Progressing   Problem: Role Relationship: Goal: Method of communication will improve Outcome: Not Progressing   Problem: Education: Goal: Knowledge of General Education information will improve Description: Including pain rating scale, medication(s)/side effects and non-pharmacologic comfort measures Outcome: Not Progressing   Problem:  Health Behavior/Discharge Planning: Goal: Ability to manage health-related needs will improve Outcome: Not Progressing   Problem: Clinical Measurements: Goal: Ability to maintain clinical measurements within normal limits will improve Outcome: Not Progressing Goal: Will remain free from infection Outcome: Not Progressing Goal: Diagnostic test results will improve Outcome: Not Progressing Goal: Respiratory complications will improve Outcome: Not Progressing Goal: Cardiovascular complication will be avoided Outcome: Not Progressing   Problem: Activity: Goal: Risk for activity intolerance will decrease Outcome: Not Progressing   Problem: Nutrition: Goal: Adequate nutrition will be maintained Outcome: Not Progressing   Problem: Coping: Goal: Level of anxiety will decrease Outcome: Not Progressing   Problem: Elimination: Goal: Will not experience complications related to bowel motility Outcome: Not Progressing Goal: Will not experience complications related to urinary retention Outcome: Not Progressing   Problem: Pain Managment: Goal: General experience of comfort will improve Outcome: Not Progressing   Problem: Safety: Goal: Ability to remain free from injury will improve Outcome: Not Progressing   Problem: Skin Integrity: Goal: Risk for impaired skin integrity will decrease Outcome: Not Progressing

## 2023-02-19 NOTE — Progress Notes (Signed)
NAME:  Eugene Hardin, MRN:  [id number], DOB:  Mar 07, 1961, LOS: 2 ADMISSION DATE:  March 03, 2023, CONSULTATION DATE:  March 03, 2023 REFERRING MD:  Amada Jupiter, CHIEF COMPLAINT:  AMS // endotracheally intubated    History of Present Illness:  62 yo M pmh HTN HLD who presented to ED 03/03/23 after being found down by his wife. Pt seemed to have rolled out of bed, hitting the floor. His speech was reportedly garbled, EMS was dispatched. En route his mental status declined and he required BVM. He was intubated in ED, taken for CT H which unfortunately revealed a devastating brainstem hemorrhage.   Admitted to neuro, who has spoken to family about this non-survivable bleed. His code status is subsequently updated to DNR, with expected transition to comfort care when resp of family has gathered    PCCM is consulted in this setting   Pertinent  Medical History  HTN HLD GERD Chronic pain Fibromyalgia Depression  Arthritis   Significant Hospital Events: Including procedures, antibiotic start and stop dates in addition to other pertinent events   03/03/2023 admitted for supportive care off non-survivable brainstem hemorrhage, made DNR and plan for organ donation  Interim History / Subjective:   Awaiting organ donation Organ donor services involved in case  Objective   Blood pressure (!) 146/83, pulse (!) 102, temperature (!) 102.2 F (39 C), resp. rate (!) 26, height 5\' 2"  (1.575 m), weight 99.3 kg, SpO2 96%.    Vent Mode: PRVC FiO2 (%):  [40 %-70 %] 70 % Set Rate:  [26 bmp] 26 bmp Vt Set:  [490 mL] 490 mL PEEP:  [12 cmH20] 12 cmH20 Plateau Pressure:  [24 cmH20-26 cmH20] 25 cmH20   Intake/Output Summary (Last 24 hours) at 02/07/2023 1255 Last data filed at 01/25/2023 1100 Gross per 24 hour  Intake 2385.42 ml  Output 1440 ml  Net 945.42 ml   Filed Weights   March 03, 2023 0829 03-03-2023 1600  Weight: 120 kg 99.3 kg    Examination: General: critically ill obese adult M intubated NAD HENT: NCAT  pink mm anicteric sclera ETT secure  Lungs: mechanically ventilated  Cardiovascular: rrr  Abdomen: obese soft  Extremities: no acute joint deformity.  Neuro: sedated GU: foley   Resolved Hospital Problem list     Assessment & Plan:   DNR status  Awaiting organ donation Large Brainstem hemorrhage  Acute respiratory failure with hypercarbia HTN  Hx HLD Hx GERD Hx chronic pain  AKI Obesity, BMI 40 P -Supportive measures until organ donation -continue mechanical ventilatory support - continue maintenance fluids - continue sedation -VAP, pulm hygiene  -no chemical dvt ppx or antiplt    Labs   CBC: Recent Labs  Lab Mar 03, 2023 0821 03-03-2023 0829 03/03/2023 2343 02/05/23 0128 02/05/23 0508 02/05/23 1032 02/05/23 1054 02/05/23 1803 02/05/23 2342 01/28/2023 0053 02/17/2023 0459 02/16/2023 1129  WBC 10.1   < > 8.3  --  7.4  --  9.9  --  8.1  --  8.8  --   NEUTROABS 3.1  --   --   --   --   --   --   --   --   --   --   --   HGB 16.5   < > 15.3   < > 14.7   < > 15.9 15.0 15.0 14.6 15.3 15.0  HCT 50.4   < > 44.5   < > 42.5   < > 46.2 44.0 43.3 43.0 43.9 44.0  MCV 100.8*   < >  96.5  --  97.3  --  96.5  --  95.6  --  95.0  --   PLT 215   < > 223  --  180  --  221  --  181  --  196  --    < > = values in this interval not displayed.    Basic Metabolic Panel: Recent Labs  Lab 2023-02-07 2201 02/05/23 0128 02/05/23 0508 02/05/23 1032 02/05/23 1054 02/05/23 1222 02/05/23 1721 02/05/23 1745 02/05/23 1803 02/05/23 2342 02/12/2023 0053 02/10/2023 0459 02/15/2023 1129  NA  --    < > 135   < > 135  --  136  --  135 135 135 134* 134*  K  --    < > 3.6   < > 3.6  --  3.6  --  3.7 3.5 3.9 4.2 4.5  CL  --   --  102  --  100  --  100  --   --  98  --  98  --   CO2  --   --  25  --  24  --  24  --   --  25  --  24  --   GLUCOSE  --   --  105*  --  118*  --  122*  --   --  122*  --  143*  --   BUN  --   --  16  --  18  --  18  --   --  17  --  18  --   CREATININE  --   --  1.58*  --   1.61*  --  1.31*  --   --  1.27*  --  1.37*  --   CALCIUM  --   --  9.1  --  9.0  --  9.1  --   --  8.9  --  8.9  --   MG 1.7  --   --   --   --  1.8  --  2.3  --  2.1  --  2.2  --   PHOS 2.4*  --   --   --   --  3.3  --  5.3*  --  5.7*  --  5.8*  --    < > = values in this interval not displayed.   GFR: Estimated Creatinine Clearance: 58.8 mL/min (A) (by C-G formula based on SCr of 1.37 mg/dL (H)). Recent Labs  Lab 02-07-23 1846 02/07/2023 2201 02/05/23 0508 02/05/23 1054 02/05/23 2342 02/05/2023 0459  WBC  --    < > 7.4 9.9 8.1 8.8  LATICACIDVEN 1.4  --   --   --   --   --    < > = values in this interval not displayed.    Liver Function Tests: Recent Labs  Lab 02/05/23 0508 02/05/23 1054 02/05/23 1721 02/05/23 2342 02/09/2023 0459  AST 135* 135* 139* 135* 130*  ALT 82* 85* 80* 75* 73*  ALKPHOS 99 106 102 98 102  BILITOT 1.1 1.5* 1.1 1.6* 1.5*  PROT 6.8 7.7 7.4 7.5 7.7  ALBUMIN 3.6 4.0 3.7 3.6 3.6   Recent Labs  Lab 02/05/23 1222 02/05/23 1745 02/05/23 2342  LIPASE 34 32 30  AMYLASE 38 36 34   No results for input(s): "AMMONIA" in the last 168 hours.  ABG    Component Value Date/Time   PHART 7.303 (L) 01/28/2023 1129   PCO2ART 54.9 (  H) 02-12-2023 1129   PO2ART 135 (H) 02/12/23 1129   HCO3 26.5 February 12, 2023 1129   TCO2 28 02/12/2023 1129   ACIDBASEDEF 2.0 02/03/2023 0830   O2SAT 98 12-Feb-2023 1129     Coagulation Profile: Recent Labs  Lab 02/12/2023 2343 02/05/23 1054 02/05/23 1721 02/05/23 2342 12-Feb-2023 0459  INR 1.2 1.2 1.2 1.2 1.2    Cardiac Enzymes: Recent Labs  Lab 02/05/23 1222 02/05/23 1745 02/05/23 2342 2023/02/12 0459  CKTOTAL 1,747* 2,737* 2,495* 2,532*    HbA1C: Hgb A1c MFr Bld  Date/Time Value Ref Range Status  02/05/2023 06:00 PM 5.0 4.8 - 5.6 % Final    Comment:    (NOTE) Pre diabetes:          5.7%-6.4%  Diabetes:              >6.4%  Glycemic control for   <7.0% adults with diabetes     CBG: Recent Labs  Lab  02/05/23 1552 02/05/23 1924 02/05/23 2310 February 12, 2023 0324 02-12-23 1114  GLUCAP 121* 119* 130* 134* 155*       Critical care time: 33 min     CRITICAL CARE Performed by: Martina Sinner  Total critical care time: 31 minutes  Critical care time was exclusive of separately billable procedures and treating other patients. Critical care was necessary to treat or prevent imminent or life-threatening deterioration.  Critical care was time spent personally by me on the following activities: development of treatment plan with patient and/or surrogate as well as nursing, discussions with consultants, evaluation of patient's response to treatment, examination of patient, obtaining history from patient or surrogate, ordering and performing treatments and interventions, ordering and review of laboratory studies, ordering and review of radiographic studies, pulse oximetry and re-evaluation of patient's condition.  Melody Comas, MD [facility] Office: [phone]   See Amion for personal pager PCCM on call pager [phone] until 7pm. Please call Elink 7p-7a. [phone]

## 2023-02-19 NOTE — Progress Notes (Signed)
Pt extubated in OR per [facility] at bedside.

## 2023-02-19 NOTE — Progress Notes (Signed)
Pt verified per protocol cardiac death @ 1822 in OR prior to Organ precurement. Primary MD, and Elink notified. Death checklist initiated. Family information obtained and Patient placement info provided.60ml Morphine, 65ml Versed & 200 Ml fentanyl wasted per protocol with 2nd RN that also pronounced cardiac time of death... [employer].

## 2023-02-19 DEATH — deceased

## 2023-11-04 IMAGING — MR MR ABDOMEN WO/W CM
11 of 17 series · 28 of 48 positions shown · IV contrast (multihance)
Comparison: CT October 13, 2020 and ultrasound April 11, 2021

CLINICAL DATA: Further evaluation of renal lesion seen on prior CT
and ultrasound

EXAM:
MRI ABDOMEN WITHOUT AND WITH CONTRAST
TECHNIQUE: Multiplanar multisequence MR imaging of the abdomen was performed
both before and after the administration of intravenous contrast.
CONTRAST:  20mL MULTIHANCE GADOBENATE DIMEGLUMINE 529 MG/ML IV SOLN

[Series 3: cor haste · coronal · 5.0mm · 0.78mm/px · 2 of 40 slices shown]
[im 1/40]
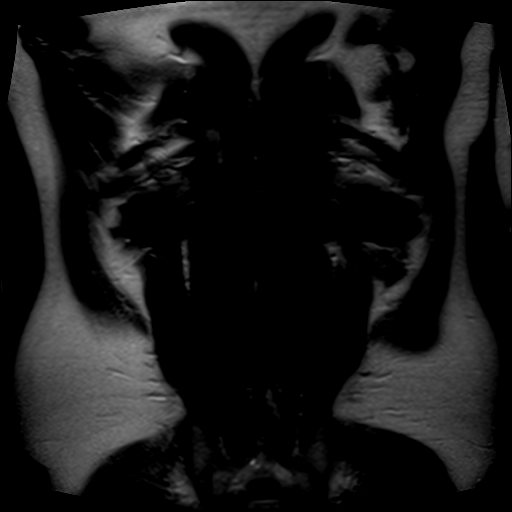
[im 40/40]
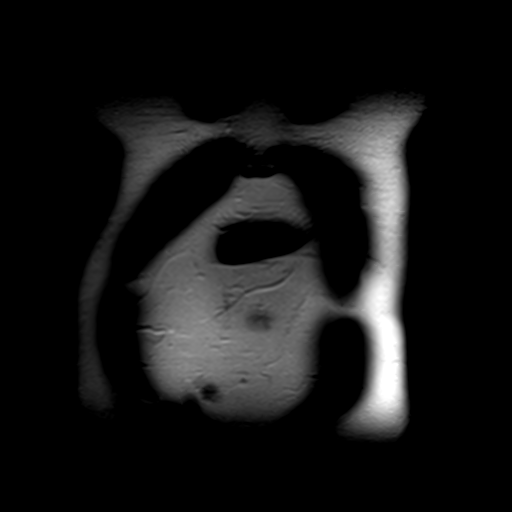

[Series 4: axial haste · axial · 6.0mm · 0.80mm/px · z∈[-149,+120]mm · 2 of 42 slices shown]
[im 1/42]
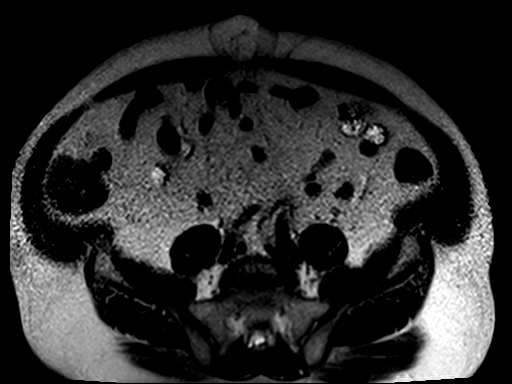
[im 42/42]
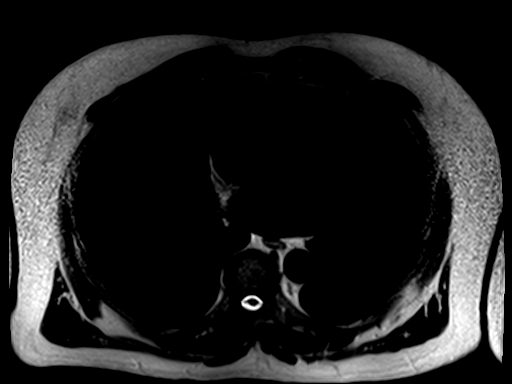

[Series 5: T1 · axial · 6.0mm · 0.80mm/px · z∈[-146,+117]mm · 4 of 82 slices shown]
[im 1/82]
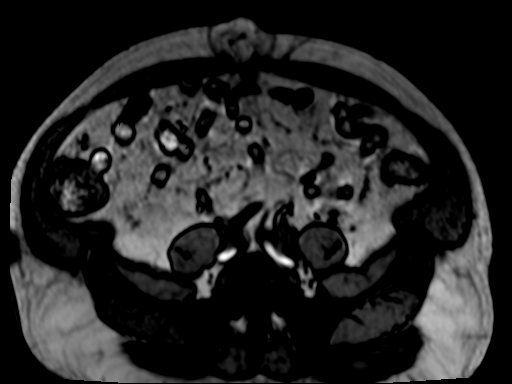
[im 28/82]
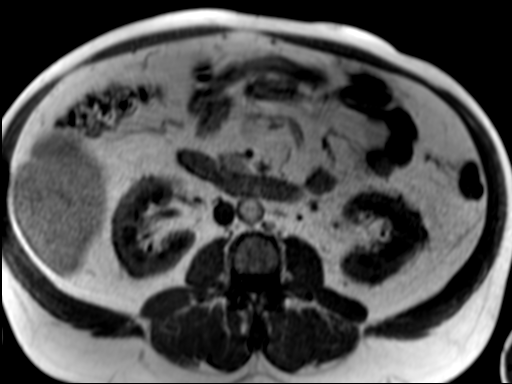
[im 55/82]
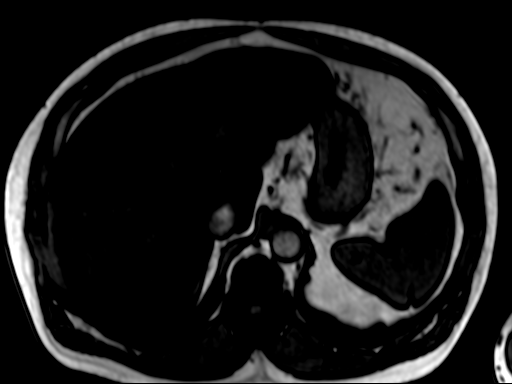
[im 82/82]
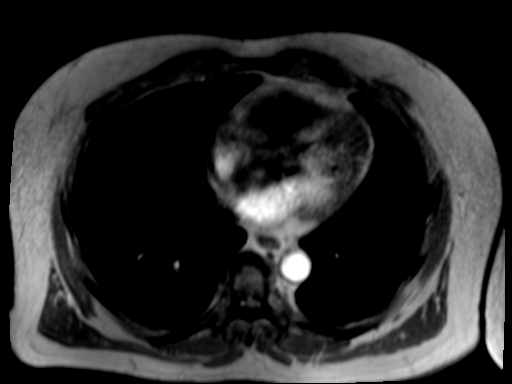

[Series 6: bSSFP · axial · 4.0mm · 0.80mm/px · z∈[-144,+115]mm · 3 of 66 slices shown]
[im 1/66]
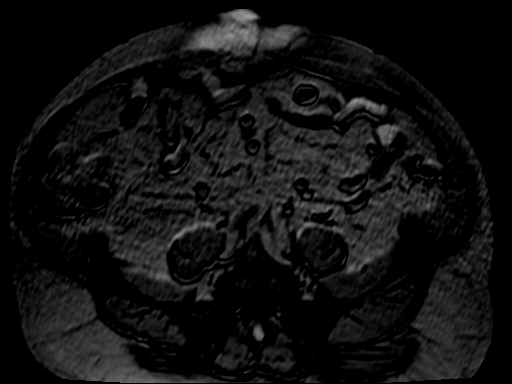
[im 33/66]
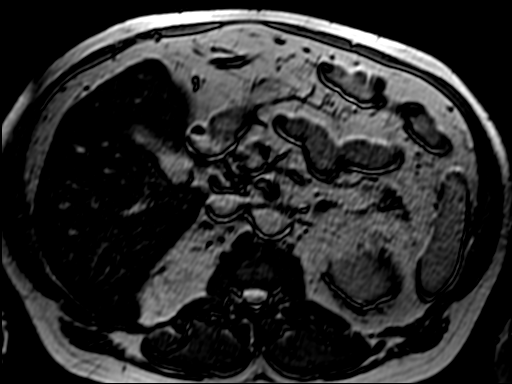
[im 66/66]
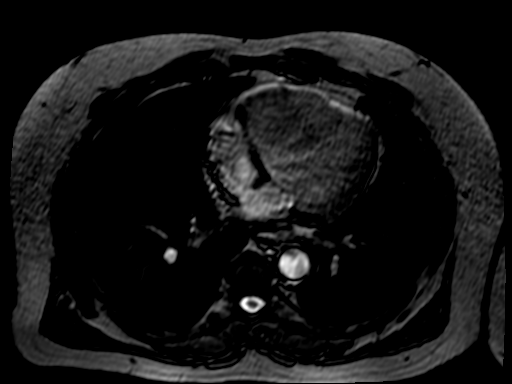

[Series 7: T2 fat-sat · axial · 6.0mm · 1.28mm/px · z∈[-148,+132]mm · 2 of 40 slices shown]
[im 1/40]
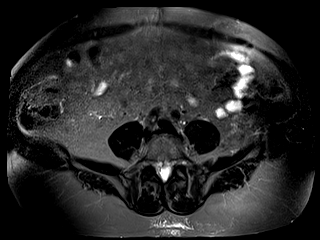
[im 40/40]
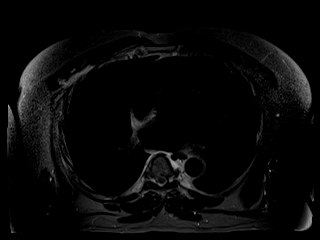

[Series 8: [id number]_trig · axial · 6.0mm · 2.14mm/px · z∈[-148,+132]mm · 4 of 120 slices shown]
[im 1/120]
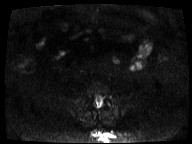
[im 40/120]
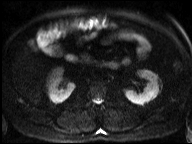
[im 80/120]
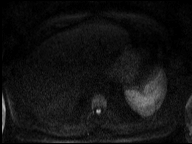
[im 120/120]
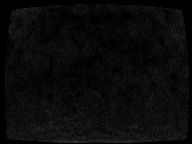

[Series 9: [id number]_p2_trig_adc · axial · 6.0mm · 2.14mm/px · 1 of 40 slices shown]
[im 1/40]
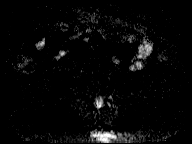

[Series 10: T1 dynamic · axial · non-contrast · 2.5mm · 0.80mm/px · z∈[-134,+123]mm · 3 of 104 slices shown]
[im 1/104]
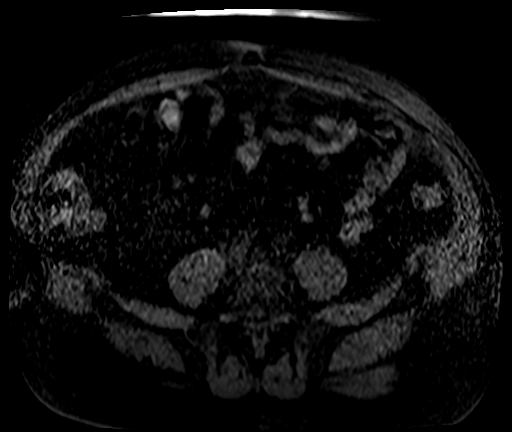
[im 52/104]
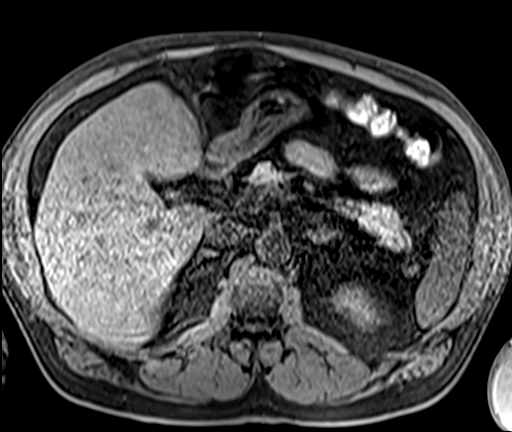
[im 104/104]
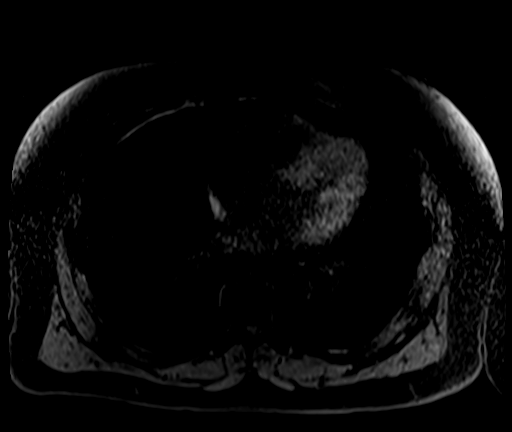

[Series 11: T1 dynamic post-contrast · axial · 2.5mm · 0.80mm/px · z∈[-134,+123]mm · 3 of 104 slices shown (1 of 3)]
[im 1/104]
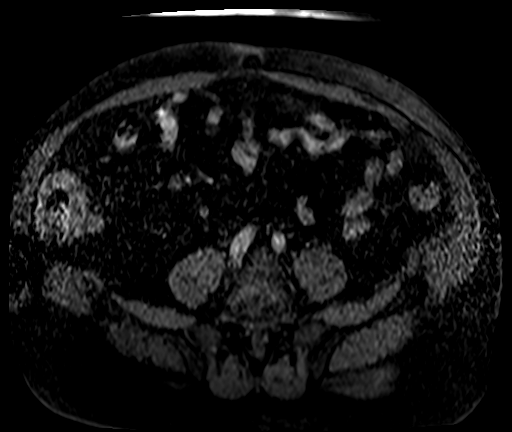
[im 52/104]
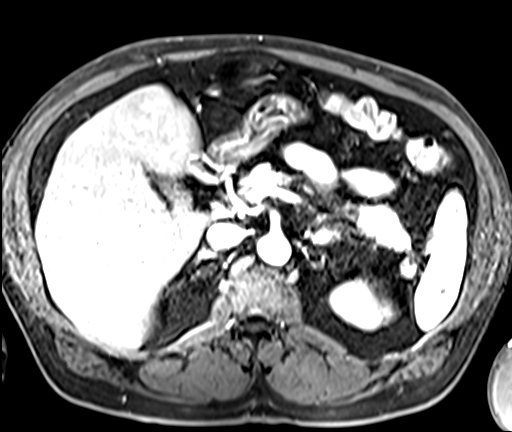
[im 104/104]
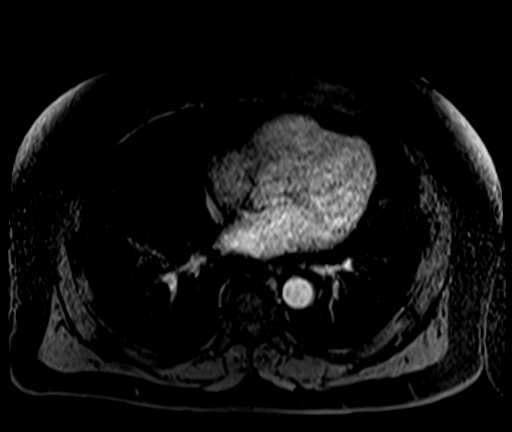

[Series 12: T1 dynamic post-contrast · axial · 2.5mm · 0.80mm/px · z∈[-134,+123]mm · 3 of 104 slices shown (2 of 3)]
[im 1/104]
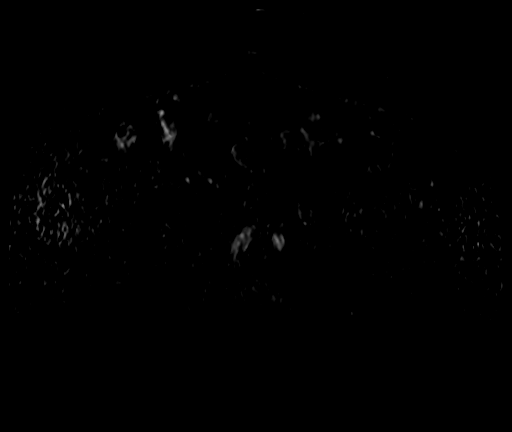
[im 52/104]
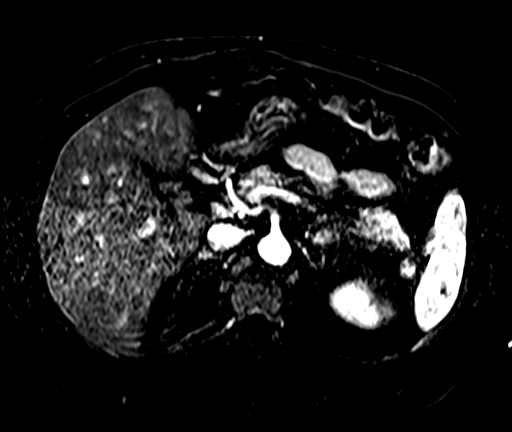
[im 104/104]
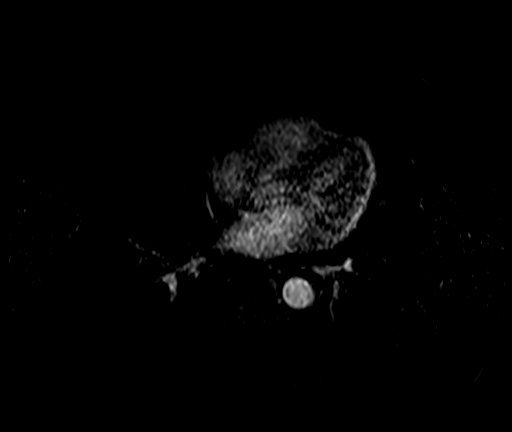

[Series 13: T1 dynamic post-contrast · axial · 2.5mm · 0.80mm/px · 1 of 104 slices shown (3 of 3)]
[im 1/104]
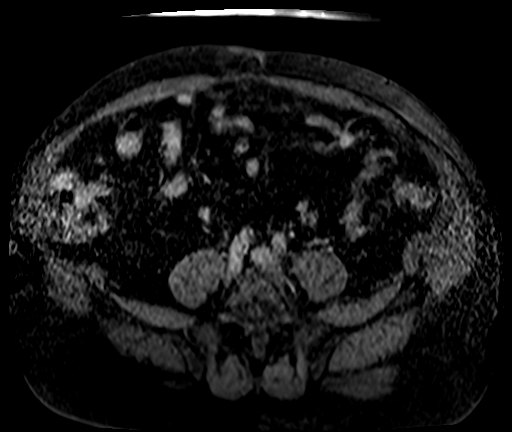

[28 of 48 positions shown; findings below may reference images not displayed]

FINDINGS: Lower chest: No acute abnormality.

Hepatobiliary: Moderate diffuse hepatic steatosis. No suspicious
hepatic lesion. Gallbladder is unremarkable. No biliary ductal
dilation.

Pancreas: No pancreatic ductal dilation or evidence of acute
inflammation. No pancreatic divisum. No cystic or solid
hyperenhancing pancreatic lesion visualized.

Spleen:  Within normal limits in size and appearance.

Adrenals/Urinary Tract: Bilateral adrenal glands are unremarkable.
T2 hyperintense 1 cm left interpolar renal lesion which demonstrates
2 thin enhancing internal septations without wall thickening or
enhancing nodularity consistent with a benign Bosniak classification
2 renal cysts. No solid enhancing renal lesion. No hydronephrosis.

Stomach/Bowel: Visualized portions within the abdomen are
unremarkable.

Vascular/Lymphatic: No pathologically enlarged lymph nodes
identified. No abdominal aortic aneurysm demonstrated.

Other:  No abdominal ascites.  Small fat containing ventral hernia.

Musculoskeletal: No suspicious bone lesions identified.
IMPRESSION: 1. Benign 1 cm Bosniak classification 2 renal cyst in the interpolar
left kidney corresponding with the lesion seen on prior CT and
ultrasound. No solid enhancing renal lesion.
2. Moderate diffuse hepatic steatosis.

## 2023-11-11 IMAGING — DX DG KNEE 1-2V PORT*R*
1 series · 2 of 2 positions shown · non-contrast
Comparison: Right knee x-ray 02/21/2021

CLINICAL DATA: Status post right knee replacement.

EXAM:
PORTABLE RIGHT KNEE - 1-2 VIEW

[Series 1: knee · 0.14mm/px · 2 of 2 slices shown]
[im 1/2]
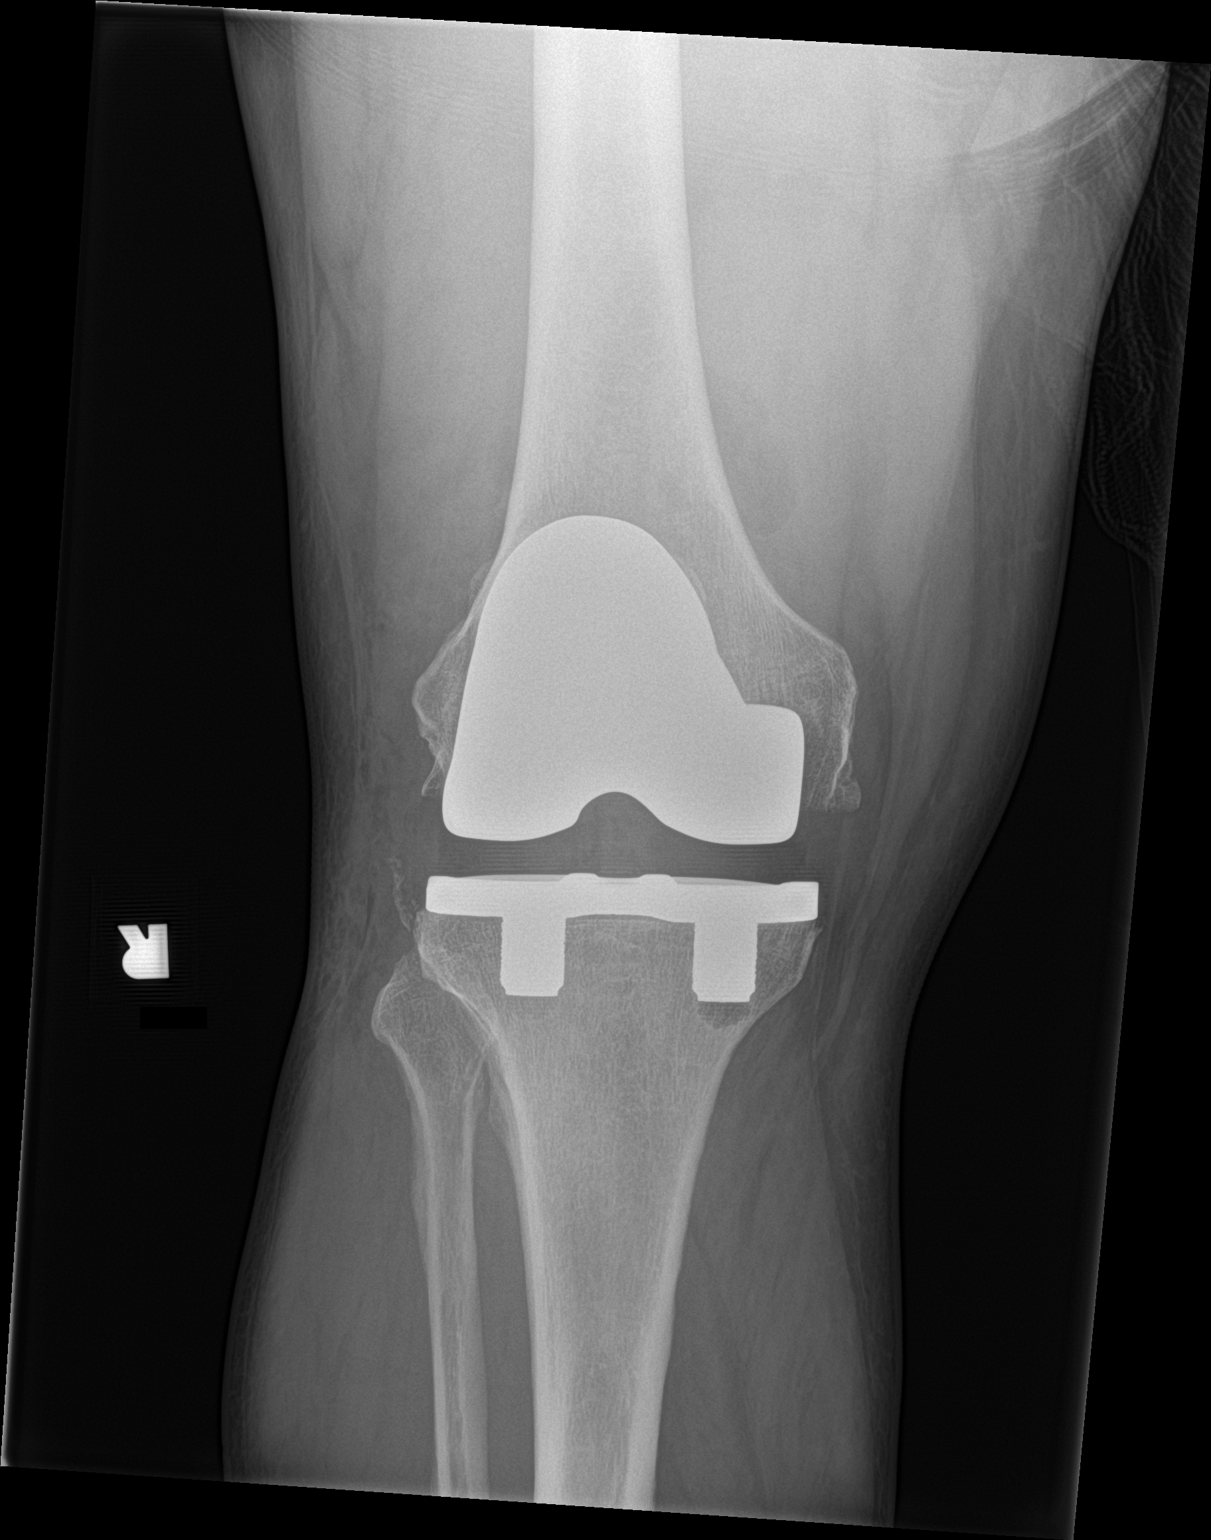
[im 2/2]
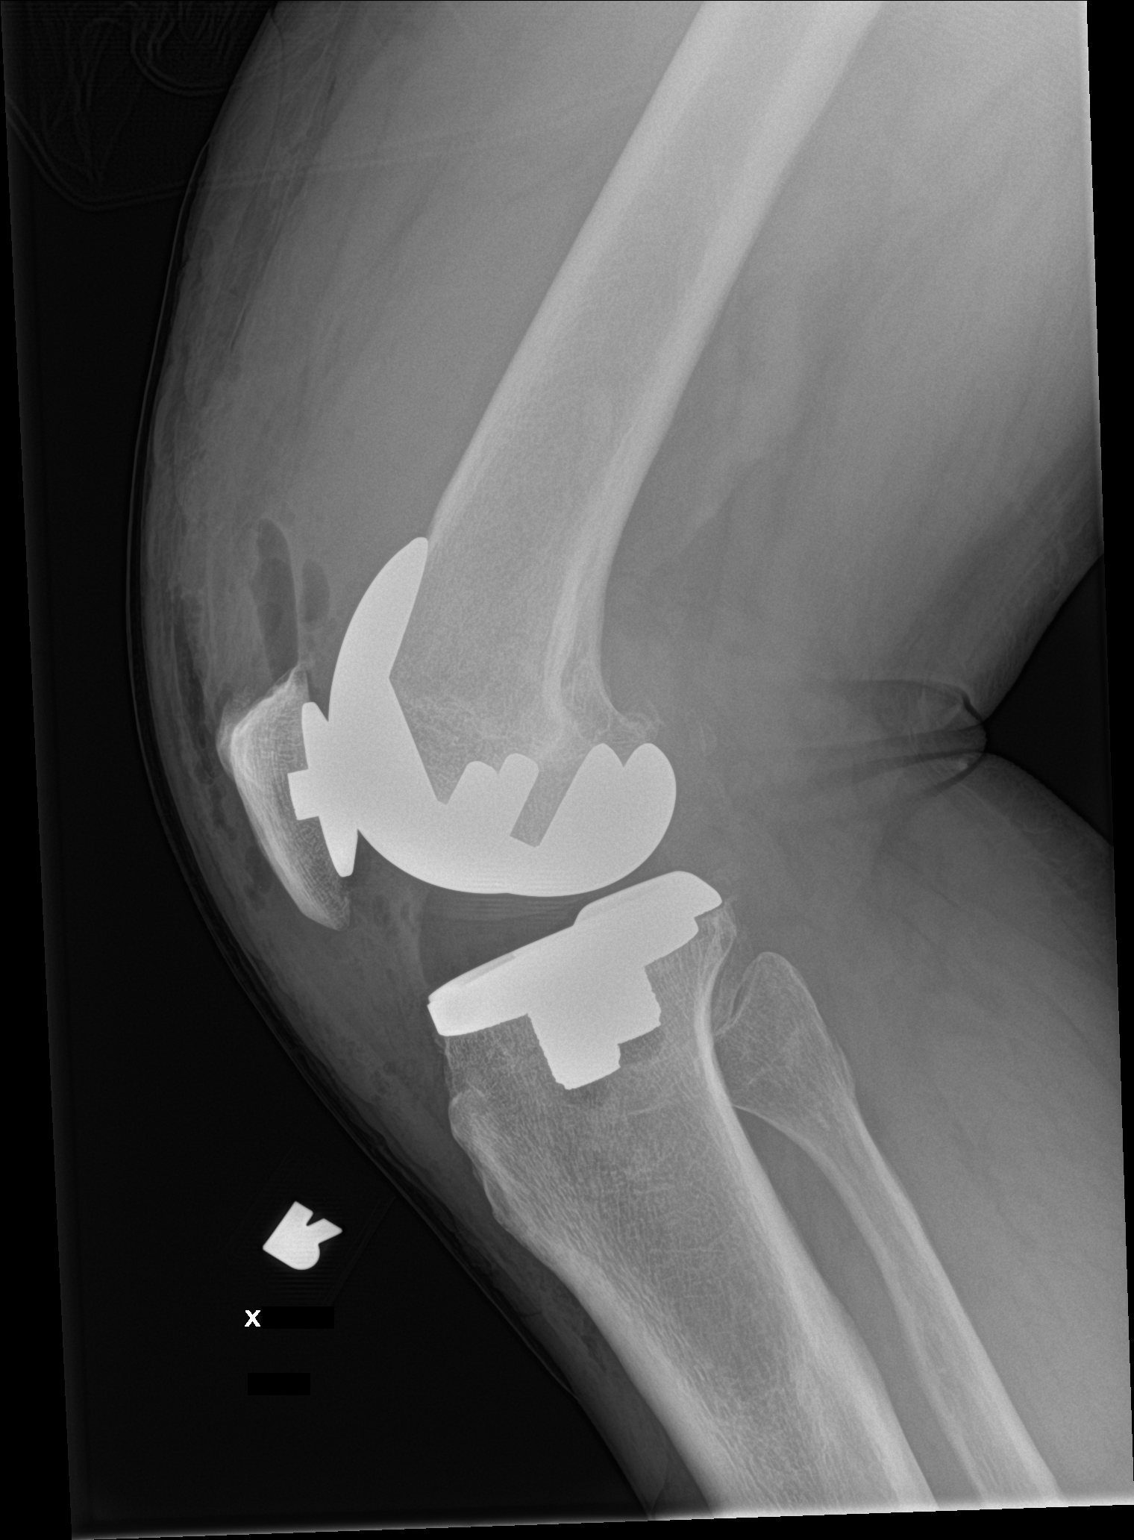

[2 of 2 positions shown; findings below may reference images not displayed]

FINDINGS: There is a new right knee total arthroplasty in anatomic alignment.
No hardware loosening or fracture. There is anterior soft tissue
swelling and air compatible with recent surgery.
IMPRESSION: 1. New right knee total arthroplasty in anatomic alignment.
